# Patient Record
Sex: Female | Born: 1990 | Race: Black or African American | Hispanic: No | Marital: Single | Smoking: Never smoker
Health system: Southern US, Community
[De-identification: ages and names within clinical notes are randomized; demographics above are authoritative.]

## PROBLEM LIST (undated history)

## (undated) ENCOUNTER — Inpatient Hospital Stay (HOSPITAL_COMMUNITY): Payer: Self-pay

## (undated) ENCOUNTER — Ambulatory Visit (HOSPITAL_COMMUNITY): Admission: EM | Payer: Medicaid Other | Source: Home / Self Care

## (undated) DIAGNOSIS — IMO0002 Reserved for concepts with insufficient information to code with codable children: Secondary | ICD-10-CM

## (undated) DIAGNOSIS — Z6791 Unspecified blood type, Rh negative: Secondary | ICD-10-CM

## (undated) DIAGNOSIS — Q690 Accessory finger(s): Secondary | ICD-10-CM

## (undated) DIAGNOSIS — Z91010 Allergy to peanuts: Secondary | ICD-10-CM

## (undated) DIAGNOSIS — A749 Chlamydial infection, unspecified: Secondary | ICD-10-CM

## (undated) DIAGNOSIS — O26899 Other specified pregnancy related conditions, unspecified trimester: Secondary | ICD-10-CM

## (undated) HISTORY — PX: ROOT CANAL: SHX2363

## (undated) HISTORY — PX: WISDOM TOOTH EXTRACTION: SHX21

---
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
## 2008-03-29 DIAGNOSIS — R87619 Unspecified abnormal cytological findings in specimens from cervix uteri: Secondary | ICD-10-CM

## 2008-03-29 DIAGNOSIS — IMO0002 Reserved for concepts with insufficient information to code with codable children: Secondary | ICD-10-CM

## 2008-03-29 HISTORY — DX: Reserved for concepts with insufficient information to code with codable children: IMO0002

## 2008-03-29 HISTORY — DX: Unspecified abnormal cytological findings in specimens from cervix uteri: R87.619

## 2009-02-20 ENCOUNTER — Emergency Department (HOSPITAL_COMMUNITY): Admission: EM | Admit: 2009-02-20 | Discharge: 2009-02-20 | Payer: Self-pay | Admitting: Emergency Medicine

## 2009-03-24 ENCOUNTER — Encounter: Admission: RE | Admit: 2009-03-24 | Discharge: 2009-03-24 | Payer: Self-pay | Admitting: Internal Medicine

## 2010-07-01 LAB — URINALYSIS, ROUTINE W REFLEX MICROSCOPIC
Bilirubin Urine: NEGATIVE
Glucose, UA: NEGATIVE mg/dL
Ketones, ur: NEGATIVE mg/dL
Nitrite: NEGATIVE
Protein, ur: NEGATIVE mg/dL
Specific Gravity, Urine: 1.03 (ref 1.005–1.030)
Urobilinogen, UA: 0.2 mg/dL (ref 0.0–1.0)
pH: 5.5 (ref 5.0–8.0)

## 2010-07-01 LAB — URINE MICROSCOPIC-ADD ON

## 2010-07-01 LAB — POCT PREGNANCY, URINE: Preg Test, Ur: NEGATIVE

## 2010-07-01 LAB — WET PREP, GENITAL: Trich, Wet Prep: NONE SEEN

## 2010-12-22 LAB — POCT PREGNANCY, URINE
Operator id: 151391
Preg Test, Ur: NEGATIVE

## 2010-12-24 LAB — POCT PREGNANCY, URINE
Operator id: 208841
Preg Test, Ur: NEGATIVE

## 2010-12-24 LAB — POCT URINALYSIS DIP (DEVICE)
Glucose, UA: NEGATIVE
Nitrite: POSITIVE — AB
Operator id: 208841
Protein, ur: >=300 — AB
Urobilinogen, UA: 0.2

## 2010-12-24 LAB — GC/CHLAMYDIA PROBE AMP, GENITAL
Chlamydia, DNA Probe: NEGATIVE
GC Probe Amp, Genital: NEGATIVE

## 2010-12-24 LAB — WET PREP, GENITAL

## 2011-01-27 IMAGING — US US PELVIS COMPLETE
1 series · 14 of 25 positions shown · non-contrast
Comparison: None

CLINICAL DATA: Pelvic pain, low back pain

TRANSABDOMINAL AND TRANSVAGINAL ULTRASOUND OF PELVIS
TECHNIQUE: Both transabdominal and transvaginal ultrasound
examinations of the pelvis were performed including evaluation of
the uterus, ovaries, adnexal regions, and pelvic cul-de-sac.

[Series 1: us pelvis complete · 0.30mm/px · 14 of 31 slices shown]
[im 1/31]
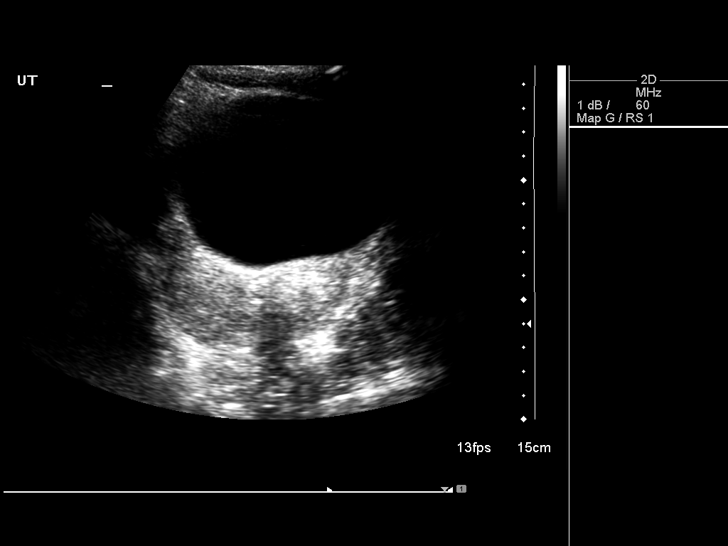
[im 3/31]
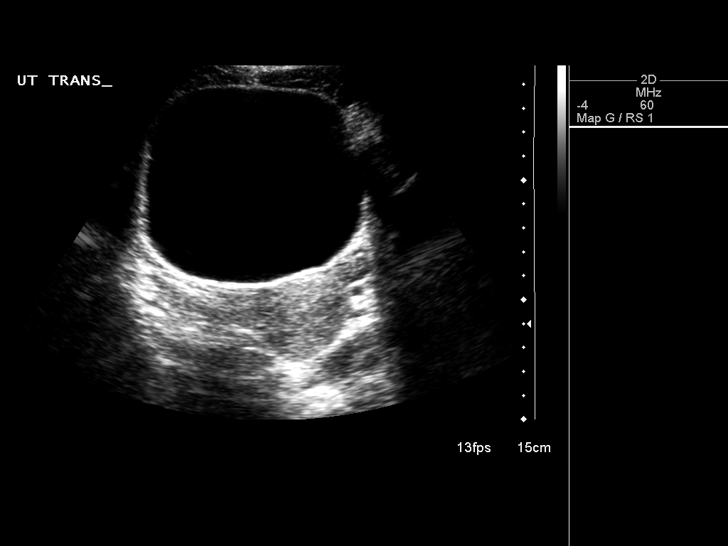
[im 6/31]
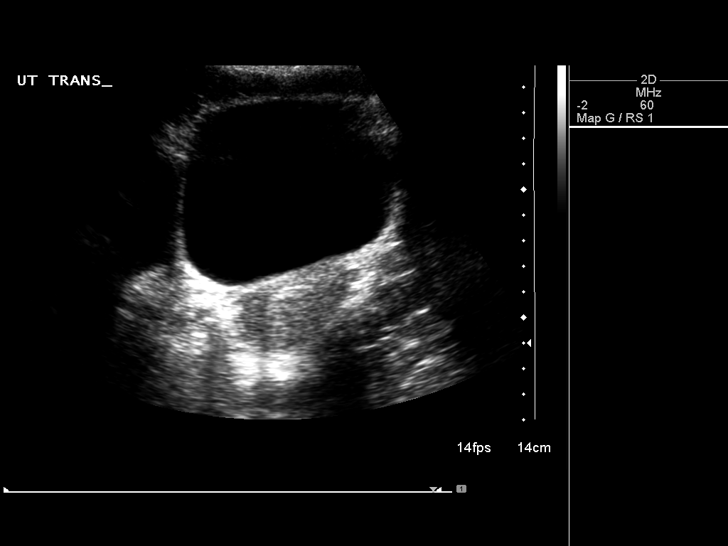
[im 8/31]
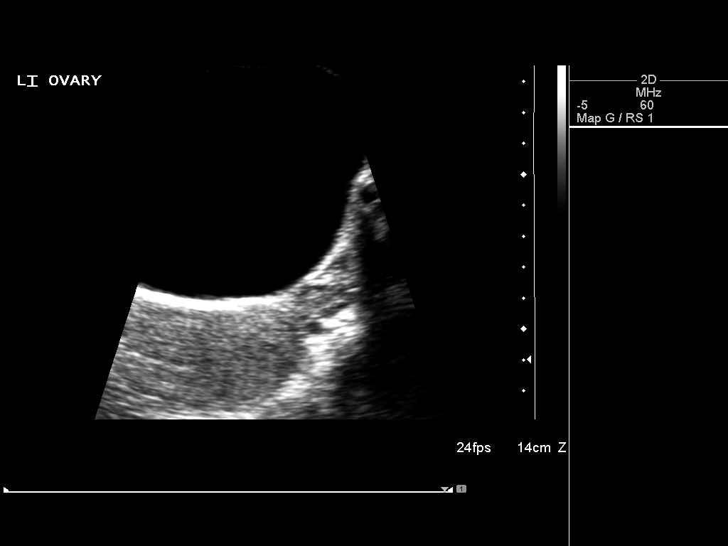
[im 11/31]
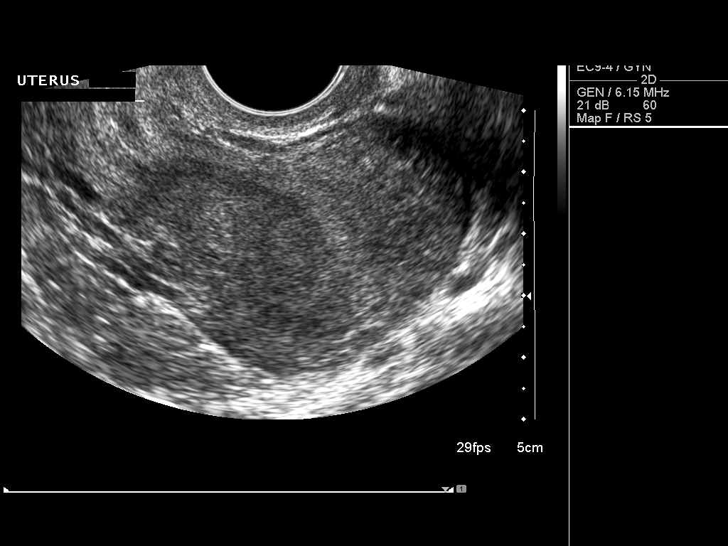
[im 12/31]
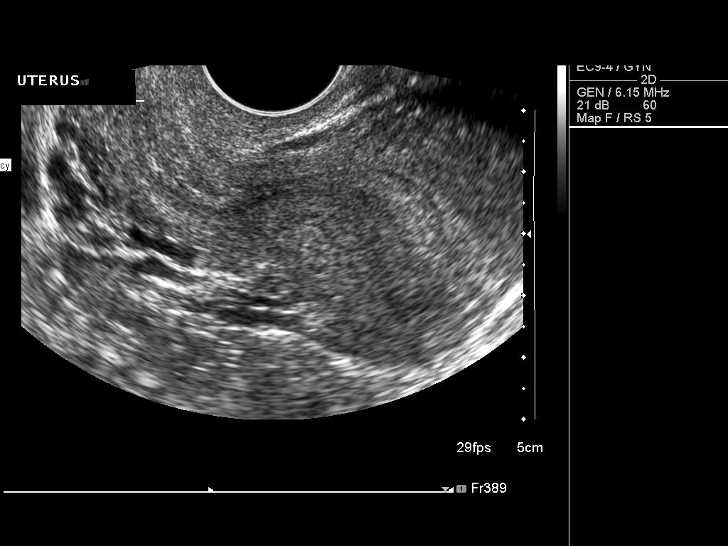
[im 14/31]
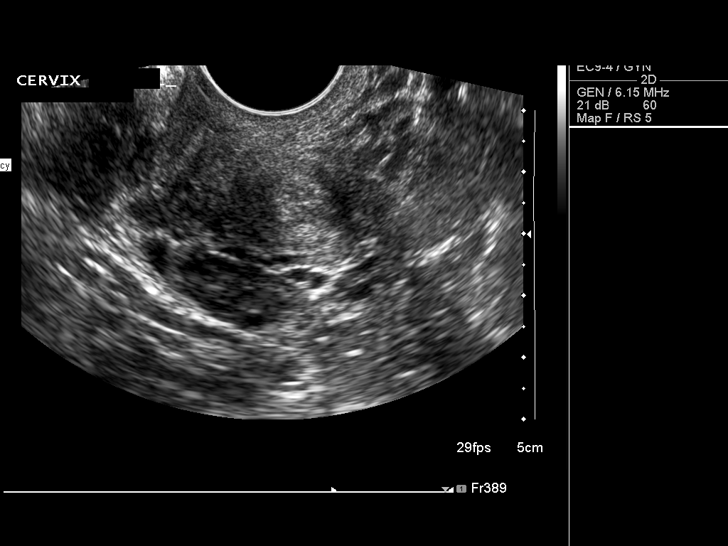
[im 17/31]
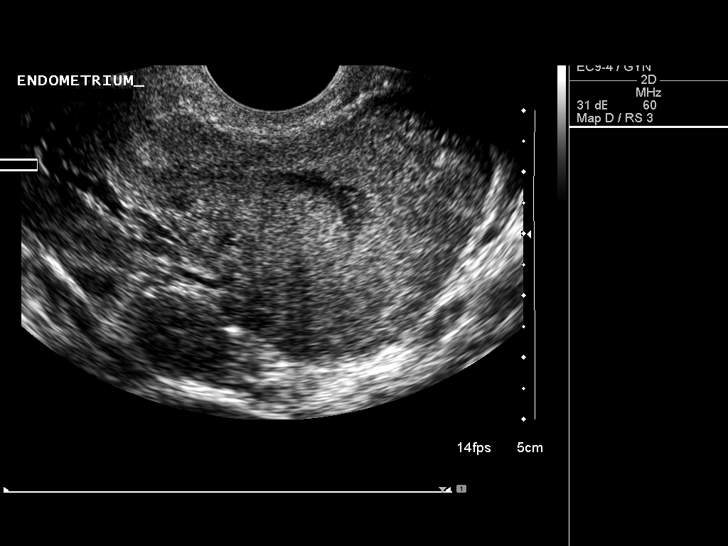
[im 19/31]
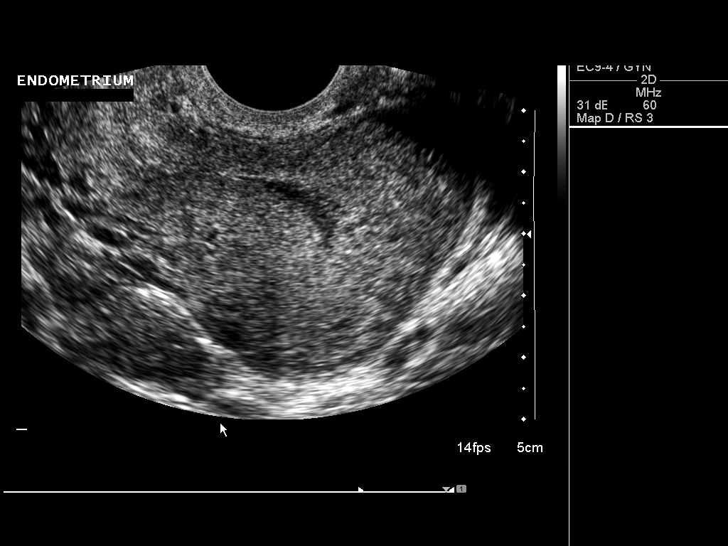
[im 21/31]
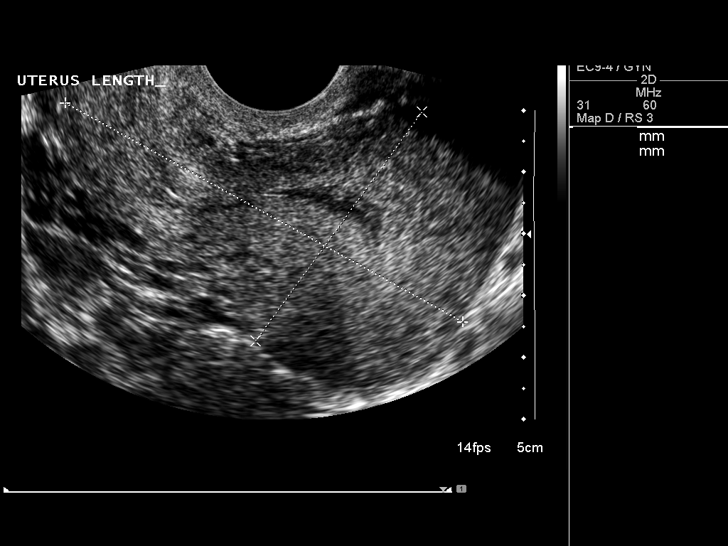
[im 23/31]
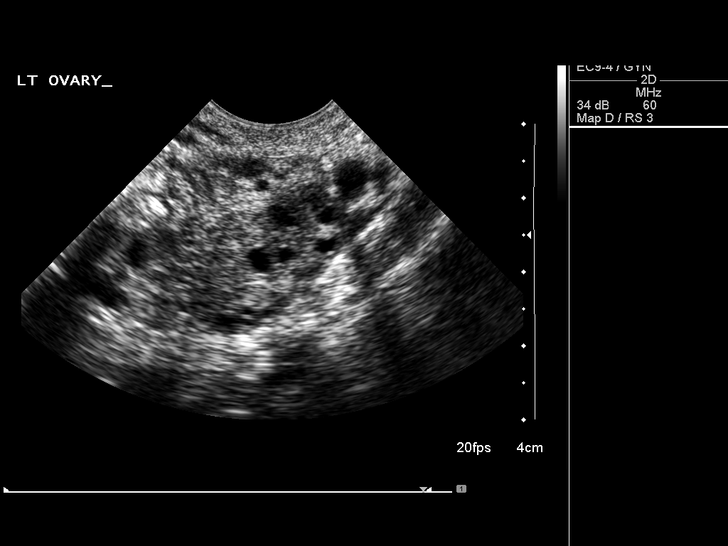
[im 26/31]
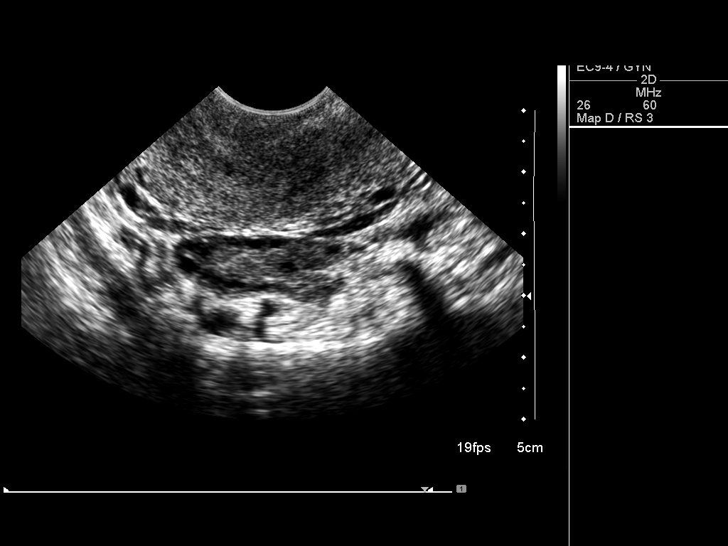
[im 28/31]
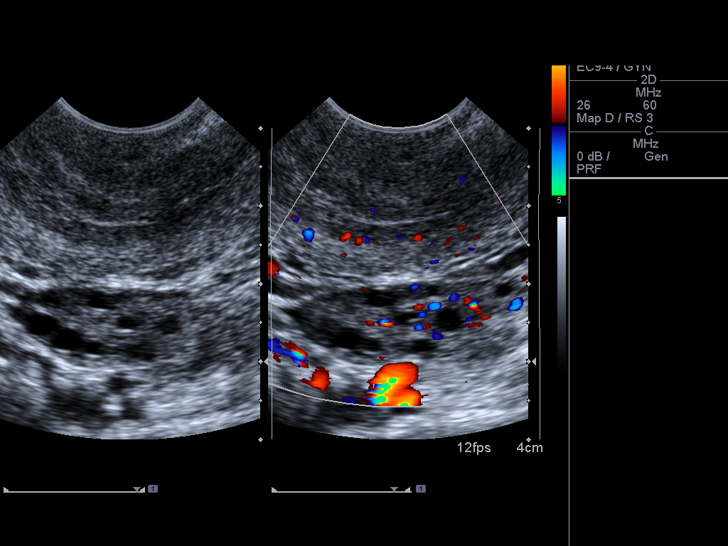
[im 31/31]
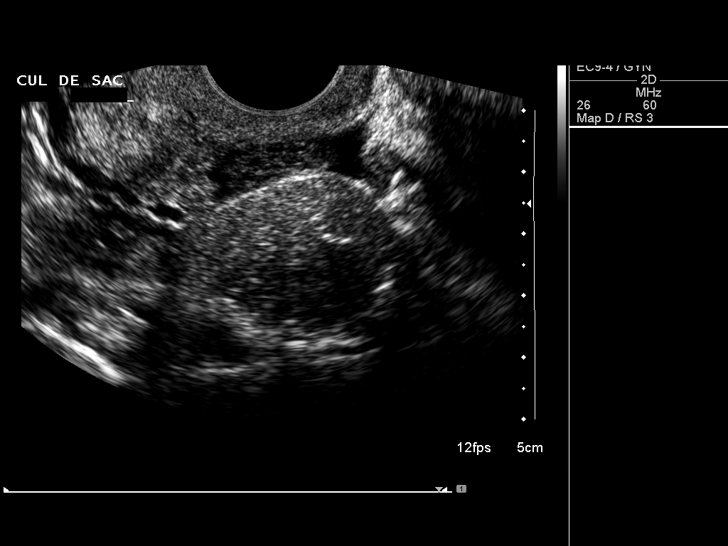

[14 of 25 positions shown; findings below may reference images not displayed]

FINDINGS: Uterus: The uterus is retroverted measuring 7.3 cm sagittally, with
a depth of 4.6 cm and width of 4.7 cm.

Endometrium:The endometrium measures 6.6 mm in thickness with some
fluid in the endometrial cavity.

Right Ovary :The right ovary is normal in size measuring 2.7 x
x 1.0 cm.

Left Ovary :The left ovary is normal measuring 2.5 x 1.3 x 1.3 cm.

Other Findings:  A small amount of fluid is noted in the cul-de-
sac.
IMPRESSION: 1.  The uterus is retroverted.  A small amount of fluid is noted in
the endometrial cavity.
 2.  The ovaries appear normal.
 3.  Small amount of fluid in cul-de-sac.

## 2012-03-29 NOTE — L&D Delivery Note (Signed)
Delivery Note At 3:48 AM a viable female, "Linda Rivas", was delivered via Vaginal, Spontaneous Delivery (Presentation: Middle Occiput Anterior).  APGAR: 8, 9; weight .   Placenta status: Intact, Spontaneous.  Cord: 3 vessels with the following complications: None.  Cord pH: NA Cord around body noted.  Patient actively pushed twice for delivery--vtx was at +3 prior to initiating pushing. 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  Anesthesia: Epidural  Episiotomy: None Lacerations: None Suture Repair: None Est. Blood Loss (mL): 200 cc  Mom to postpartum.  Baby to skin to skin.. Peds to f/u on management of vestigial extra digits. Placenta to pathology due to IUGR.   [name] 12/06/2012, 4:09 AM

## 2012-05-29 ENCOUNTER — Inpatient Hospital Stay (HOSPITAL_COMMUNITY)
Admission: AD | Admit: 2012-05-29 | Discharge: 2012-05-29 | Disposition: A | Discharge status: Home / Self Care | Payer: Medicaid Other | Source: Ambulatory Visit | Attending: Family Medicine | Admitting: Family Medicine

## 2012-05-29 ENCOUNTER — Encounter (HOSPITAL_COMMUNITY): Payer: Self-pay

## 2012-05-29 DIAGNOSIS — B3731 Acute candidiasis of vulva and vagina: Secondary | ICD-10-CM

## 2012-05-29 DIAGNOSIS — O2390 Unspecified genitourinary tract infection in pregnancy, unspecified trimester: Secondary | ICD-10-CM | POA: Insufficient documentation

## 2012-05-29 DIAGNOSIS — L259 Unspecified contact dermatitis, unspecified cause: Secondary | ICD-10-CM | POA: Insufficient documentation

## 2012-05-29 DIAGNOSIS — L309 Dermatitis, unspecified: Secondary | ICD-10-CM

## 2012-05-29 DIAGNOSIS — B373 Candidiasis of vulva and vagina: Secondary | ICD-10-CM

## 2012-05-29 DIAGNOSIS — N949 Unspecified condition associated with female genital organs and menstrual cycle: Secondary | ICD-10-CM | POA: Insufficient documentation

## 2012-05-29 LAB — URINALYSIS, ROUTINE W REFLEX MICROSCOPIC
Bilirubin Urine: NEGATIVE
Hgb urine dipstick: NEGATIVE
Specific Gravity, Urine: 1.025 (ref 1.005–1.030)
Urobilinogen, UA: 0.2 mg/dL (ref 0.0–1.0)
pH: 6 (ref 5.0–8.0)

## 2012-05-29 LAB — WET PREP, GENITAL
Clue Cells Wet Prep HPF POC: NONE SEEN
Trich, Wet Prep: NONE SEEN

## 2012-05-29 LAB — POCT PREGNANCY, URINE: Preg Test, Ur: POSITIVE — AB

## 2012-05-29 MED ORDER — FLUCONAZOLE 150 MG PO TABS: 150.0000 mg | ORAL_TABLET | Freq: Once | ORAL | Status: DC

## 2012-05-29 NOTE — MAU Provider Note (Signed)
History     CSN: [id number]  Arrival date & time 05/29/12  1150   None     Chief Complaint  Patient presents with  . Vaginal Pain    HPI MAYLI COVINGTON is a 22 y.o. @ [redacted]w[redacted]d gestation  who presents to the MAU with vaginal irritation. The symptoms started 2 weeks ago. Associated symptoms include thick white cheesy discharge and itching. She used OTC yeast cream on the outside and symptoms got better for a few days but then came back. The history was provided by the patient.  Past Medical History  Diagnosis Date  . Medical history non-contributory     No past surgical history on file.  No family history on file.  History  Substance Use Topics  . Smoking status: Not on file  . Smokeless tobacco: Not on file  . Alcohol Use: Not on file    OB History   Grav Para Term Preterm Abortions TAB SAB Ect Mult Living   1               Review of Systems  Constitutional: Negative for fever, chills, diaphoresis and fatigue.  HENT: Negative for ear pain, congestion, sore throat, facial swelling, neck pain, neck stiffness, dental problem and sinus pressure.   Eyes: Negative for photophobia, pain and discharge.  Respiratory: Negative for cough, chest tightness and wheezing.   Gastrointestinal: Negative for nausea, vomiting, abdominal pain, diarrhea, constipation and abdominal distention.  Genitourinary: Positive for frequency. Negative for dysuria, flank pain and difficulty urinating.       Vaginal discharge  Musculoskeletal: Negative for myalgias, back pain and gait problem.  Skin: Positive for rash. Negative for color change and wound.  Neurological: Negative for dizziness, speech difficulty, weakness, light-headedness, numbness and headaches.  Psychiatric/Behavioral: Negative for confusion and agitation. The patient is not nervous/anxious.     Allergies  Review of patient's allergies indicates not on file.  Home Medications  No current outpatient prescriptions on file.  BP  119/74  Pulse 109  Temp(Src) 98.2 F (36.8 C) (Oral)  Resp 16  Ht 5\' 1"  (1.549 m)  Wt 100 lb 6.4 oz (45.541 kg)  BMI 18.98 kg/m2  LMP 03/08/2012  Physical Exam  Nursing note and vitals reviewed. Constitutional: She is oriented to person, place, and time. She appears well-developed and well-nourished.  HENT:  Head: Normocephalic and atraumatic.  Eyes: EOM are normal. Pupils are equal, round, and reactive to light.  Neck: Neck supple.  Cardiovascular: Normal rate and regular rhythm.   Pulmonary/Chest: Effort normal.  Abdominal: Soft. There is no tenderness.  Positive FHT's.  Genitourinary:  External genitalia without lesions. Thick white discharge vaginal vault. Cervix long, closed, no CMT, no adnexal tenderness. Uterus consistent with dates.  Musculoskeletal: Normal range of motion. She exhibits no edema.  Neurological: She is alert and oriented to person, place, and time. No cranial nerve deficit.  Skin: Skin is warm and dry. Rash noted. No erythema.  eczema  Psychiatric: She has a normal mood and affect. Her behavior is normal. Judgment and thought content normal.   Procedures   Assessment: 22 y.o. female @ [redacted]w[redacted]d gestation with monilia vaginosis   Eczema  Plan:  Diflucan 150 mg PO now   Discussed moisturizing cream for eczema   Follow up for prenatal care.  Discussed with the patient and all questioned fully answered. She will return if any problems arise.    Medication List    ASK your doctor about these medications  folic acid 400 MCG tablet  Commonly known as:  FOLVITE  Take 800 mcg by mouth daily.

## 2012-05-29 NOTE — MAU Note (Signed)
Vaginal irritation, no change in vaginal discharge, headaches over the weekend, none today.

## 2012-05-30 LAB — GC/CHLAMYDIA PROBE AMP
CT Probe RNA: NEGATIVE
GC Probe RNA: NEGATIVE

## 2012-05-31 ENCOUNTER — Other Ambulatory Visit: Payer: Self-pay | Admitting: Medical

## 2012-05-31 DIAGNOSIS — B373 Candidiasis of vulva and vagina: Secondary | ICD-10-CM

## 2012-05-31 MED ORDER — FLUCONAZOLE 150 MG PO TABS: 150.0000 mg | ORAL_TABLET | Freq: Once | ORAL | Status: DC

## 2012-05-31 NOTE — Progress Notes (Signed)
Linda Rivas is a 22 y.o. G1P0 at [redacted]w[redacted]d who called MAU today because her Rx for Diflucan was not sent to her pharmacy. She saw Kerrie Buffalo, NP on 05/29/12. The note indicates that the patient should have been treated here in MAU, but the patient states that she was not given any medications while here that day. I have sent Rx for Diflucan to the pharmacy that she requested today, [facility] on [address] ([place]).

## 2012-05-31 NOTE — Progress Notes (Signed)
Phone call from patient:  Linda Rivas calls today stating that she was seen in MAU on Tuesday 05/29/12 and was prescribed Diflucan.  She reports that the Diflucan Rx has never appeared at the [facility] on [address] [facility].  She is requesting someone resend that prescription to [facility] on [address] today.  Ms. Ress can be reached at [phone]

## 2012-06-01 NOTE — MAU Provider Note (Signed)
Chart reviewed and agree with management and plan.  

## 2012-06-19 ENCOUNTER — Other Ambulatory Visit: Payer: Self-pay | Admitting: Obstetrics and Gynecology

## 2012-06-20 LAB — AFP, QUAD SCREEN
HCG, Total: 66469 m[IU]/mL
Interpretation-AFP: NEGATIVE
MoM for AFP: 2.07
MoM for hCG: 1.22
Open Spina bifida: NEGATIVE
Osb Risk: 1:1280 {titer}
Tri 18 Scr Risk Est: NEGATIVE
Trisomy 18 (Edward) Syndrome Interp.: 1:212000 {titer}
uE3 Mom: 1.41
uE3 Value: 0.4 ng/mL

## 2012-06-20 LAB — PRENATAL PANEL VII
Hemoglobin: 11.3 g/dL — ABNORMAL LOW (ref 12.0–15.0)
Hepatitis B Surface Ag: NEGATIVE
Lymphocytes Relative: 25 % (ref 12–46)
Lymphs Abs: 1.9 10*3/uL (ref 0.7–4.0)
MCV: 87.2 fL (ref 78.0–100.0)
Monocytes Relative: 10 % (ref 3–12)
Neutrophils Relative %: 64 % (ref 43–77)
Platelets: 266 10*3/uL (ref 150–400)
RBC: 3.84 MIL/uL — ABNORMAL LOW (ref 3.87–5.11)
Rubella: 1.53 Index — ABNORMAL HIGH (ref <0.90)
WBC: 7.8 10*3/uL (ref 4.0–10.5)

## 2012-06-20 LAB — OB RESULTS CONSOLE GC/CHLAMYDIA: Gonorrhea: NEGATIVE

## 2012-06-20 LAB — GC/CHLAMYDIA PROBE AMP, URINE: Chlamydia, Swab/Urine, PCR: NEGATIVE

## 2012-06-21 LAB — HEMOGLOBINOPATHY EVALUATION
Hemoglobin Other: 0 %
Hgb S Quant: 0 %

## 2012-06-21 LAB — CULTURE, OB URINE: Organism ID, Bacteria: NO GROWTH

## 2012-09-07 ENCOUNTER — Inpatient Hospital Stay (HOSPITAL_COMMUNITY)
Admission: AD | Admit: 2012-09-07 | Discharge: 2012-09-07 | Disposition: A | Discharge status: Home / Self Care | Payer: Medicaid Other | Source: Ambulatory Visit | Attending: Obstetrics and Gynecology | Admitting: Obstetrics and Gynecology

## 2012-09-07 ENCOUNTER — Encounter (HOSPITAL_COMMUNITY): Payer: Self-pay | Admitting: *Deleted

## 2012-09-07 DIAGNOSIS — R05 Cough: Secondary | ICD-10-CM | POA: Insufficient documentation

## 2012-09-07 DIAGNOSIS — R079 Chest pain, unspecified: Secondary | ICD-10-CM | POA: Insufficient documentation

## 2012-09-07 DIAGNOSIS — O99891 Other specified diseases and conditions complicating pregnancy: Secondary | ICD-10-CM | POA: Insufficient documentation

## 2012-09-07 DIAGNOSIS — R0602 Shortness of breath: Secondary | ICD-10-CM | POA: Insufficient documentation

## 2012-09-07 DIAGNOSIS — R059 Cough, unspecified: Secondary | ICD-10-CM | POA: Insufficient documentation

## 2012-09-07 NOTE — MAU Note (Signed)
Having some problems with her breathing, esp when she lays down at times.  Brings on a cough, feels a little tight.  No hx of asthma.

## 2012-09-07 NOTE — MAU Provider Note (Signed)
  History     CSN: [id number]  Arrival date and time: 09/07/12 1754   None     Chief Complaint  Patient presents with  . Shortness of Breath   HPI Comments: Pt is a G1P0 at [redacted]w[redacted]d that arrives unannounced to MAU after calling office today for advice regarding cough and chest tightness, and was instructed to go to urgent care or primary care. Upon questioning pt states that she has a dry cough and chest tightness mostly when she's laying down or if she exerts herself. She denies any fever or chills, she denies feeling SOB currently. SHe denies any pain, no ctx, vb or LOF, reports +FM. She reports occ having allergies in the spring, no hx asthma.   Shortness of Breath Pertinent negatives include no abdominal pain, chest pain, fever, leg swelling, orthopnea, sore throat, sputum production, vomiting or wheezing.      Past Medical History  Diagnosis Date  . Medical history non-contributory     No past surgical history on file.  No family history on file.  History  Substance Use Topics  . Smoking status: Not on file  . Smokeless tobacco: Not on file  . Alcohol Use: Not on file    Allergies: No Known Allergies  Prescriptions prior to admission  Medication Sig Dispense Refill  . Prenatal Vit-Fe Fumarate-FA (PRENATAL MULTIVITAMIN) TABS Take 1 tablet by mouth daily at 12 noon.        Review of Systems  Constitutional: Negative for fever and malaise/fatigue.  HENT: Negative for congestion and sore throat.   Respiratory: Positive for cough and shortness of breath. Negative for sputum production and wheezing.        Non-productive   Cardiovascular: Negative for chest pain, palpitations, orthopnea and leg swelling.  Gastrointestinal: Negative for heartburn, nausea, vomiting and abdominal pain.   Physical Exam   Blood pressure 107/57, pulse 88, temperature 98.2 F (36.8 C), temperature source Oral, resp. rate 20, weight 109 lb (49.442 kg), last menstrual period 03/08/2012, SpO2  100.00%.  Physical Exam  Nursing note and vitals reviewed. Constitutional: She is oriented to person, place, and time. She appears well-developed and well-nourished.  HENT:  Head: Normocephalic.  Eyes: Pupils are equal, round, and reactive to light.  Neck: Normal range of motion.  Cardiovascular: Normal rate, regular rhythm and normal heart sounds.   Respiratory: Effort normal and breath sounds normal.  GI: Soft. Bowel sounds are normal.  Genitourinary: Vagina normal.  Musculoskeletal: Normal range of motion.  Neurological: She is alert and oriented to person, place, and time. She has normal reflexes.  Skin: Skin is warm and dry.  Psychiatric: She has a normal mood and affect. Her behavior is normal.   FHR 130 reactive for GA toco quiet   MAU Course  Procedures    Assessment and Plan  IUP at [redacted]w[redacted]d VSS NAD Appears to be physiologic sx's r/t pregnancy, rv'd comfort measures Refer to pulmonology if sx's persist  Dc home stable condition, routine f/u   Emerlyn Mehlhoff M 09/07/2012, 6:24 PM

## 2012-11-29 DIAGNOSIS — O365990 Maternal care for other known or suspected poor fetal growth, unspecified trimester, not applicable or unspecified: Secondary | ICD-10-CM

## 2012-11-29 HISTORY — DX: Maternal care for other known or suspected poor fetal growth, unspecified trimester, not applicable or unspecified: O36.5990

## 2012-11-30 ENCOUNTER — Telehealth (HOSPITAL_COMMUNITY): Payer: Self-pay | Admitting: *Deleted

## 2012-11-30 NOTE — Telephone Encounter (Signed)
Preadmission screen  

## 2012-12-05 ENCOUNTER — Inpatient Hospital Stay (HOSPITAL_COMMUNITY)
Admission: RE | Admit: 2012-12-05 | Discharge: 2012-12-08 | DRG: 775 | Disposition: A | Discharge status: Home / Self Care | Payer: Medicaid Other | Source: Ambulatory Visit | Attending: Obstetrics and Gynecology | Admitting: Obstetrics and Gynecology

## 2012-12-05 ENCOUNTER — Encounter (HOSPITAL_COMMUNITY): Payer: Self-pay

## 2012-12-05 DIAGNOSIS — O99892 Other specified diseases and conditions complicating childbirth: Secondary | ICD-10-CM | POA: Diagnosis present

## 2012-12-05 DIAGNOSIS — IMO0002 Reserved for concepts with insufficient information to code with codable children: Secondary | ICD-10-CM | POA: Diagnosis present

## 2012-12-05 DIAGNOSIS — Z6791 Unspecified blood type, Rh negative: Secondary | ICD-10-CM | POA: Insufficient documentation

## 2012-12-05 DIAGNOSIS — Q740 Other congenital malformations of upper limb(s), including shoulder girdle: Secondary | ICD-10-CM

## 2012-12-05 DIAGNOSIS — O360990 Maternal care for other rhesus isoimmunization, unspecified trimester, not applicable or unspecified: Secondary | ICD-10-CM | POA: Diagnosis present

## 2012-12-05 DIAGNOSIS — O358XX0 Maternal care for other (suspected) fetal abnormality and damage, not applicable or unspecified: Secondary | ICD-10-CM | POA: Diagnosis present

## 2012-12-05 DIAGNOSIS — O365990 Maternal care for other known or suspected poor fetal growth, unspecified trimester, not applicable or unspecified: Principal | ICD-10-CM | POA: Diagnosis present

## 2012-12-05 DIAGNOSIS — O26899 Other specified pregnancy related conditions, unspecified trimester: Secondary | ICD-10-CM | POA: Insufficient documentation

## 2012-12-05 HISTORY — DX: Unspecified blood type, rh negative: Z67.91

## 2012-12-05 HISTORY — DX: Chlamydial infection, unspecified: A74.9

## 2012-12-05 HISTORY — DX: Other specified pregnancy related conditions, unspecified trimester: O26.899

## 2012-12-05 HISTORY — DX: Accessory finger(s): Q69.0

## 2012-12-05 HISTORY — DX: Allergy to peanuts: Z91.010

## 2012-12-05 HISTORY — DX: Reserved for concepts with insufficient information to code with codable children: IMO0002

## 2012-12-05 LAB — CBC
MCV: 92.1 fL (ref 78.0–100.0)
Platelets: 166 10*3/uL (ref 150–400)
RBC: 4.17 MIL/uL (ref 3.87–5.11)
RDW: 12.5 % (ref 11.5–15.5)
WBC: 7.1 10*3/uL (ref 4.0–10.5)

## 2012-12-05 MED ORDER — LIDOCAINE HCL (PF) 1 % IJ SOLN
30.0000 mL | INTRAMUSCULAR | Status: DC | PRN
  Filled 2012-12-05 (×2): qty 30

## 2012-12-05 MED ORDER — LACTATED RINGERS IV SOLN
INTRAVENOUS | Status: DC
  Administered 2012-12-05 – 2012-12-06 (×3): via INTRAVENOUS

## 2012-12-05 MED ORDER — BUTORPHANOL TARTRATE 1 MG/ML IJ SOLN
1.0000 mg | INTRAMUSCULAR | Status: DC | PRN
  Administered 2012-12-06: 1 mg via INTRAVENOUS
  Filled 2012-12-05: qty 1

## 2012-12-05 MED ORDER — LACTATED RINGERS IV SOLN
500.0000 mL | INTRAVENOUS | Status: DC | PRN
  Administered 2012-12-06 (×3): 500 mL via INTRAVENOUS

## 2012-12-05 MED ORDER — OXYTOCIN BOLUS FROM INFUSION
500.0000 mL | INTRAVENOUS | Status: DC
  Administered 2012-12-06: 500 mL via INTRAVENOUS

## 2012-12-05 MED ORDER — ZOLPIDEM TARTRATE 5 MG PO TABS
5.0000 mg | ORAL_TABLET | Freq: Every evening | ORAL | Status: DC | PRN
  Administered 2012-12-05: 5 mg via ORAL
  Filled 2012-12-05: qty 1

## 2012-12-05 MED ORDER — IBUPROFEN 600 MG PO TABS
600.0000 mg | ORAL_TABLET | Freq: Four times a day (QID) | ORAL | Status: DC | PRN
  Filled 2012-12-05: qty 1

## 2012-12-05 MED ORDER — CITRIC ACID-SODIUM CITRATE 334-500 MG/5ML PO SOLN: 30.0000 mL | ORAL | Status: DC | PRN

## 2012-12-05 MED ORDER — ACETAMINOPHEN 325 MG PO TABS: 650.0000 mg | ORAL_TABLET | ORAL | Status: DC | PRN

## 2012-12-05 MED ORDER — OXYTOCIN 40 UNITS IN LACTATED RINGERS INFUSION - SIMPLE MED: 1.0000 m[IU]/min | INTRAVENOUS | Status: DC

## 2012-12-05 MED ORDER — OXYTOCIN 40 UNITS IN LACTATED RINGERS INFUSION - SIMPLE MED
1.0000 m[IU]/min | INTRAVENOUS | Status: DC
  Administered 2012-12-05: 1 m[IU]/min via INTRAVENOUS
  Filled 2012-12-05: qty 1000

## 2012-12-05 MED ORDER — FLEET ENEMA 7-19 GM/118ML RE ENEM: 1.0000 | ENEMA | RECTAL | Status: DC | PRN

## 2012-12-05 MED ORDER — ONDANSETRON HCL 4 MG/2ML IJ SOLN: 4.0000 mg | Freq: Four times a day (QID) | INTRAMUSCULAR | Status: DC | PRN

## 2012-12-05 MED ORDER — MISOPROSTOL 25 MCG QUARTER TABLET: 25.0000 ug | ORAL_TABLET | ORAL | Status: DC | PRN

## 2012-12-05 MED ORDER — TERBUTALINE SULFATE 1 MG/ML IJ SOLN: 0.2500 mg | Freq: Once | INTRAMUSCULAR | Status: AC | PRN

## 2012-12-05 MED ORDER — OXYCODONE-ACETAMINOPHEN 5-325 MG PO TABS: 1.0000 | ORAL_TABLET | ORAL | Status: DC | PRN

## 2012-12-05 MED ORDER — OXYTOCIN 40 UNITS IN LACTATED RINGERS INFUSION - SIMPLE MED: 62.5000 mL/h | INTRAVENOUS | Status: DC

## 2012-12-05 NOTE — H&P (Signed)
Linda Rivas is a 22 y.o. female, G1P0 at 73 6/7 weeks, presenting for induction due to IUGR.  Denies leaking or bleeding, reports +FM.  Reports contractions all day, with 4/10 intensity at present.  Patient Active Problem List   Diagnosis Date Noted  . IUGR (intrauterine growth restriction) 12/05/2012  . 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 Rh negative status during pregnancy     History of present pregnancy: Patient entered care at 16 2/7 weeks.   EDC of 12/13/12 was established by LMP and in agreement with [place] at 6 weeks.   Anatomy scan:  19 2/7 weeks, with normal findings and an posterior/fundal placenta.  Bilateral polydactly noted. Additional [place] evaluations:   35 2/7 weeks--due to S<D, with EFW 21%ile, 5+5, normal fluid. 38 1/7 weeks--f/u on growth, with EFW <3%ile, 5+1, normal fluid/BPP/dopplers Significant prenatal events:  SGA at 35 weeks, progressing to symmetric IUGR by 38 weeks.  Received Rhophylac at 29 weeks. Last evaluation:  11/29/12, with [place] EFW 5+1, <3%ile, normal fluid, BPP 8/8 and normal dopplers.  Cervix 1 cm, 50%, vtx, -2 at that visit.  History OB History   Grav Para Term Preterm Abortions TAB SAB Ect Mult Living   1              Past Medical History  Diagnosis Date  . Chlamydia   . Polydactyly of fingers   . Abnormal Pap smear 2010  . Peanut allergy   . Rh negative status during pregnancy     Received Rhophylac at 29 weeks.   Past Surgical History  Procedure Laterality Date  . Wisdom tooth extraction    . Root canal     Family History: Father HTN, mother alcoholic Social History:  reports that she has quit smoking. She has never used smokeless tobacco. She reports that she uses illicit drugs (Marijuana). She reports that she does not drink alcohol.  No illicit drug use during pregnancy.  FOB, , is involved and supportive.    Prenatal Transfer Tool  Maternal Diabetes: No Genetic Screening: Normal Quad  screen Maternal Ultrasounds/Referrals: Abnormal:  Findings:   IUGR, symmetrical--last EFW 5+1 on 12/01/12, <3%ile.  Fetus with bilateral polydactly--patient has hx of same. Fetal Ultrasounds or other Referrals:  None Maternal Substance Abuse:  No Significant Maternal Medications:  None Significant Maternal Lab Results:  Lab values include: Group B Strep negative, Rh negative Other Comments:  Induction for IUGR  ROS:  Contractions, +FM.  Dilation: 1 Effacement (%): 70;80 Exam by:: v. Randye Treichler, cnm Blood pressure 111/62, pulse 98, temperature 98.2 F (36.8 C), temperature source Oral, resp. rate 18, height 5\' 1"  (1.549 m), weight 114 lb (51.71 kg), last menstrual period 03/08/2012.  Exam Physical Exam  Chest clear Heart RRR without murmur Abd gravid, NT Pelvic--loose 1 cm, 75%, vtx, -1, cervix slightly posterior.  Pelvis feels adequate. Ext WNL  FHR Category 1 UCs q 4 min, irregular quality.  Prenatal labs: ABO, Rh: A/NEG/-- (03/24 1250)--Received Rhophylac at 29 weeks. Antibody: NEG (03/24 1250) Rubella: 1.53 (03/24 1250) RPR: NON REAC (03/24 1250)  HBsAg: NEGATIVE (03/24 1250)  HIV: NON REACTIVE (03/24 1250)  GBS: Negative (09/04 0000)  Hgb electrophoresis WNL: Glucola WNL Hgb 11.3 at NOB, 11.1 at 29 weeks Platelets 266 at NOB, 203 at 29 weeks Pap WNL 06/2012. Cultures negative at NOB and 8/0/14.  Assessment/Plan: IUP at 38 6/7 weeks IUGR--for induction ? Early labor GBS negative  Plan: Admitted to [facility] per  consult with Dr. Estanislado Pandy Routine [facility] orders Continuous EFM Will start low dose pitocin for cervical ripening during night, with advancement in am or if active labor ensues. Pain medication options reviewed. Reviewed indications and R/B of induction with patient and family, including possibility of need for serial induction, additional intervention, NRFHR, and need for C/S--patient and family seem to understand these risks and are in agreement with  proceeding with induction.  Nigel Bridgeman 12/05/2012, 8:52 PM

## 2012-12-06 ENCOUNTER — Encounter (HOSPITAL_COMMUNITY): Payer: Self-pay | Admitting: Anesthesiology

## 2012-12-06 ENCOUNTER — Inpatient Hospital Stay (HOSPITAL_COMMUNITY): Payer: Medicaid Other | Admitting: Anesthesiology

## 2012-12-06 ENCOUNTER — Encounter (HOSPITAL_COMMUNITY): Payer: Self-pay

## 2012-12-06 LAB — CBC
HCT: 34.7 % — ABNORMAL LOW (ref 36.0–46.0)
MCH: 31.9 pg (ref 26.0–34.0)
MCV: 93 fL (ref 78.0–100.0)
RDW: 12.5 % (ref 11.5–15.5)
WBC: 10.9 10*3/uL — ABNORMAL HIGH (ref 4.0–10.5)

## 2012-12-06 LAB — RPR: RPR Ser Ql: NONREACTIVE

## 2012-12-06 MED ORDER — LANOLIN HYDROUS EX OINT: TOPICAL_OINTMENT | CUTANEOUS | Status: DC | PRN

## 2012-12-06 MED ORDER — PRENATAL MULTIVITAMIN CH: 1.0000 | ORAL_TABLET | Freq: Every day | ORAL | Status: DC

## 2012-12-06 MED ORDER — RHO D IMMUNE GLOBULIN 1500 UNIT/2ML IJ SOLN
300.0000 ug | Freq: Once | INTRAMUSCULAR | Status: AC
  Administered 2012-12-06: 300 ug via INTRAMUSCULAR
  Filled 2012-12-06: qty 2

## 2012-12-06 MED ORDER — OXYCODONE-ACETAMINOPHEN 5-325 MG PO TABS
1.0000 | ORAL_TABLET | ORAL | Status: DC | PRN
  Filled 2012-12-06: qty 1

## 2012-12-06 MED ORDER — DIBUCAINE 1 % RE OINT: 1.0000 "application " | TOPICAL_OINTMENT | RECTAL | Status: DC | PRN

## 2012-12-06 MED ORDER — WITCH HAZEL-GLYCERIN EX PADS: 1.0000 "application " | MEDICATED_PAD | CUTANEOUS | Status: DC | PRN

## 2012-12-06 MED ORDER — ONDANSETRON HCL 4 MG PO TABS: 4.0000 mg | ORAL_TABLET | ORAL | Status: DC | PRN

## 2012-12-06 MED ORDER — SENNOSIDES-DOCUSATE SODIUM 8.6-50 MG PO TABS
2.0000 | ORAL_TABLET | ORAL | Status: DC
  Administered 2012-12-07: 2 via ORAL

## 2012-12-06 MED ORDER — BENZOCAINE-MENTHOL 20-0.5 % EX AERO: 1.0000 "application " | INHALATION_SPRAY | CUTANEOUS | Status: DC | PRN

## 2012-12-06 MED ORDER — TETANUS-DIPHTH-ACELL PERTUSSIS 5-2.5-18.5 LF-MCG/0.5 IM SUSP: 0.5000 mL | Freq: Once | INTRAMUSCULAR | Status: DC

## 2012-12-06 MED ORDER — PHENYLEPHRINE 40 MCG/ML (10ML) SYRINGE FOR IV PUSH (FOR BLOOD PRESSURE SUPPORT)
80.0000 ug | PREFILLED_SYRINGE | INTRAVENOUS | Status: DC | PRN
  Filled 2012-12-06: qty 5
  Filled 2012-12-06: qty 2

## 2012-12-06 MED ORDER — FENTANYL 2.5 MCG/ML BUPIVACAINE 1/10 % EPIDURAL INFUSION (WH - ANES)
14.0000 mL/h | INTRAMUSCULAR | Status: DC | PRN
  Filled 2012-12-06: qty 125

## 2012-12-06 MED ORDER — DIPHENHYDRAMINE HCL 25 MG PO CAPS: 25.0000 mg | ORAL_CAPSULE | Freq: Four times a day (QID) | ORAL | Status: DC | PRN

## 2012-12-06 MED ORDER — FENTANYL 2.5 MCG/ML BUPIVACAINE 1/10 % EPIDURAL INFUSION (WH - ANES)
INTRAMUSCULAR | Status: DC | PRN
  Administered 2012-12-06: 14 mL/h via EPIDURAL

## 2012-12-06 MED ORDER — IBUPROFEN 600 MG PO TABS: 600.0000 mg | ORAL_TABLET | Freq: Four times a day (QID) | ORAL | Status: DC

## 2012-12-06 MED ORDER — PHENYLEPHRINE 40 MCG/ML (10ML) SYRINGE FOR IV PUSH (FOR BLOOD PRESSURE SUPPORT)
80.0000 ug | PREFILLED_SYRINGE | INTRAVENOUS | Status: DC | PRN
  Filled 2012-12-06: qty 2

## 2012-12-06 MED ORDER — ONDANSETRON HCL 4 MG/2ML IJ SOLN: 4.0000 mg | INTRAMUSCULAR | Status: DC | PRN

## 2012-12-06 MED ORDER — OXYCODONE-ACETAMINOPHEN 5-325 MG PO TABS: 1.0000 | ORAL_TABLET | ORAL | Status: DC | PRN

## 2012-12-06 MED ORDER — ZOLPIDEM TARTRATE 5 MG PO TABS: 5.0000 mg | ORAL_TABLET | Freq: Every evening | ORAL | Status: DC | PRN

## 2012-12-06 MED ORDER — SIMETHICONE 80 MG PO CHEW: 80.0000 mg | CHEWABLE_TABLET | ORAL | Status: DC | PRN

## 2012-12-06 MED ORDER — SENNOSIDES-DOCUSATE SODIUM 8.6-50 MG PO TABS: 2.0000 | ORAL_TABLET | ORAL | Status: DC

## 2012-12-06 MED ORDER — EPHEDRINE 5 MG/ML INJ
10.0000 mg | INTRAVENOUS | Status: DC | PRN
  Filled 2012-12-06: qty 4
  Filled 2012-12-06: qty 2

## 2012-12-06 MED ORDER — DIPHENHYDRAMINE HCL 50 MG/ML IJ SOLN: 12.5000 mg | INTRAMUSCULAR | Status: DC | PRN

## 2012-12-06 MED ORDER — FENTANYL 2.5 MCG/ML BUPIVACAINE 1/10 % EPIDURAL INFUSION (WH - ANES): INTRAMUSCULAR | Status: DC | PRN

## 2012-12-06 MED ORDER — LACTATED RINGERS IV SOLN
500.0000 mL | Freq: Once | INTRAVENOUS | Status: AC
  Administered 2012-12-06: 500 mL via INTRAVENOUS

## 2012-12-06 MED ORDER — IBUPROFEN 600 MG PO TABS
600.0000 mg | ORAL_TABLET | Freq: Four times a day (QID) | ORAL | Status: DC
  Administered 2012-12-06: 600 mg via ORAL
  Filled 2012-12-06 (×6): qty 1

## 2012-12-06 MED ORDER — EPHEDRINE 5 MG/ML INJ
10.0000 mg | INTRAVENOUS | Status: DC | PRN
  Filled 2012-12-06: qty 2

## 2012-12-06 MED ORDER — LIDOCAINE HCL (PF) 1 % IJ SOLN
INTRAMUSCULAR | Status: DC | PRN
  Administered 2012-12-06: 9 mL
  Administered 2012-12-06: 6 mL

## 2012-12-06 NOTE — Anesthesia Preprocedure Evaluation (Signed)
Anesthesia Evaluation  Patient identified by MRN, date of birth, ID band Patient awake    Reviewed: Allergy & Precautions, H&P , Patient's Chart, lab work & pertinent test results  Airway Mallampati: I TM Distance: >3 FB Neck ROM: full    Dental no notable dental hx.    Pulmonary neg pulmonary ROS,    Pulmonary exam normal       Cardiovascular negative cardio ROS      Neuro/Psych negative neurological ROS  negative psych ROS   GI/Hepatic negative GI ROS, Neg liver ROS,   Endo/Other  negative endocrine ROS  Renal/GU negative Renal ROS  negative genitourinary   Musculoskeletal negative musculoskeletal ROS (+)   Abdominal Normal abdominal exam  (+)   Peds negative pediatric ROS (+)  Hematology negative hematology ROS (+)   Anesthesia Other Findings   Reproductive/Obstetrics (+) Pregnancy                           Anesthesia Physical Anesthesia Plan  ASA: II  Anesthesia Plan: Epidural   Post-op Pain Management:    Induction:   Airway Management Planned:   Additional Equipment:   Intra-op Plan:   Post-operative Plan:   Informed Consent: I have reviewed the patients History and Physical, chart, labs and discussed the procedure including the risks, benefits and alternatives for the proposed anesthesia with the patient or authorized representative who has indicated his/her understanding and acceptance.     Plan Discussed with:   Anesthesia Plan Comments:         Anesthesia Quick Evaluation

## 2012-12-06 NOTE — Anesthesia Procedure Notes (Signed)
Epidural Patient location during procedure: OB Start time: 12/06/2012 1:16 AM End time: 12/06/2012 1:20 AM  Staffing Anesthesiologist: Sandrea Hughs Performed by: anesthesiologist   Preanesthetic Checklist Completed: patient identified, surgical consent, pre-op evaluation, timeout performed, IV checked, risks and benefits discussed and monitors and equipment checked  Epidural Patient position: sitting Prep: site prepped and draped and DuraPrep Patient monitoring: continuous pulse ox and blood pressure Approach: midline Injection technique: LOR air  Needle:  Needle type: Tuohy  Needle gauge: 17 G Needle length: 9 cm and 9 Needle insertion depth: 4 cm Catheter type: closed end flexible Catheter size: 19 Gauge Catheter at skin depth: 9 cm Test dose: negative and Other  Assessment Sensory level: T9 Events: blood not aspirated, injection not painful, no injection resistance, negative IV test and no paresthesia  Additional Notes Reason for block:procedure for pain

## 2012-12-06 NOTE — Progress Notes (Signed)
Patient c/o pain in lower abdomen.  Appears groggy and eyes are closed while we are talking.  Encouraged her to ambulate to the bathroom to empty bladder often.

## 2012-12-06 NOTE — Progress Notes (Signed)
UR chart review completed.  

## 2012-12-06 NOTE — Progress Notes (Signed)
  Subjective: Requesting pain medication.  Partner at bedside, supportive.  Patient breathing through contractions.   Objective: BP 112/74  Pulse 89  Temp(Src) 97.8 F (36.6 C) (Oral)  Resp 18  Ht 5\' 1"  (1.549 m)  Wt 114 lb (51.71 kg)  BMI 21.55 kg/m2  LMP 03/08/2012      FHT:  Category 1--negative CST UC:   regular, every 2-3 minutes SVE:   Dilation: 2 Effacement (%): 90 Station: -1 Exam by:: v. Eligha Kmetz, cnm Pitocin on 4 mu/min  Assessment / Plan: Induction of labor--on low-dose pitocin IUGR Will give Stadol and continue to observe  Gentri Guardado 12/06/2012, 12:11 AM

## 2012-12-06 NOTE — Anesthesia Postprocedure Evaluation (Signed)
Anesthesia Post Note  Patient: Linda Rivas  Procedure(s) Performed: * No procedures listed *  Anesthesia type: Epidural  Patient location: Mother/Baby  Post pain: Pain level controlled  Post assessment: Post-op Vital signs reviewed  Last Vitals: BP 112/74  Pulse 81  Temp(Src) 36.9 C (Oral)  Resp 18  Ht 5\' 1"  (1.549 m)  Wt 114 lb (51.71 kg)  BMI 21.55 kg/m2  SpO2 100%  LMP 03/08/2012  Post vital signs: Reviewed  Level of consciousness: awake  Complications: No apparent anesthesia complications

## 2012-12-06 NOTE — Progress Notes (Signed)
  Subjective: Epidural just placed--getting comfortable.  Objective: BP 100/66  Pulse 85  Temp(Src) 97.8 F (36.6 C) (Oral)  Resp 18  Ht 5\' 1"  (1.549 m)  Wt 114 lb (51.71 kg)  BMI 21.55 kg/m2  SpO2 100%  LMP 03/08/2012      FHT:  Category 2--series of late decels with patient on back, resolved with position change and VE, + response to scalp stim. UC:   regular, every 2 minutes SVE:   Dilation: 7 Effacement (%): 90 Station: 0 Exam by:: v. Azaiah Mello, cnm BBOW--AROM, clear fluid, small amount bloody show.  Assessment / Plan: Induction for IUGR--progressive labor Will CTO FHR status closely. Decrease pitocin prn.  Keiera Strathman 12/06/2012, 1:49 AM

## 2012-12-06 NOTE — Progress Notes (Signed)
Patient c/o 10 out of 10 pain.  Ambulated to bathroom.  Voided and stated a pain level of 3 out of 10 afterwards.  Refused pain medicine at this time.  Stated she would like to eat prior to taking medication and will call me when she is ready.

## 2012-12-06 NOTE — Progress Notes (Signed)
  Subjective: Still very sleepy s/p Stadol at 1:10am--unaware of any pressure.  Objective: BP 105/53  Pulse 81  Temp(Src) 97.6 F (36.4 C) (Oral)  Resp 18  Ht 5\' 1"  (1.549 m)  Wt 114 lb (51.71 kg)  BMI 21.55 kg/m2  SpO2 100%  LMP 03/08/2012   Total I/O In: -  Out: 150 [Urine:150]  FHT:  Category 2--baseline 120s with decreased variability since Stadol. UC:   regular, every 3-5 minutes SVE:   Dilation: 10 Effacement (%): 100 Station: +2 Exam by:: v. Nazaiah Navarrete, cnm Vtx visible at introitus without contraction Pitocin on 2 mu/min.  Assessment / Plan: 2nd stage labor Will initiate active pushing with urge or prn based on FHR tracing.  Nigel Bridgeman 12/06/2012, 3:32 AM

## 2012-12-06 NOTE — Progress Notes (Signed)
Patient states her pain is "so much better, I'm not in any pain now".  Refused to take scheduled Motrin at this time. No complaints.

## 2012-12-07 LAB — RH IG WORKUP (INCLUDES ABO/RH)
Antibody Screen: POSITIVE
Fetal Screen: NEGATIVE

## 2012-12-07 NOTE — Progress Notes (Signed)
Post Partum Day 1  Subjective: no complaints, up ad lib, voiding and tolerating PO  Objective: Blood pressure 99/66, pulse 66, temperature 97.8 F (36.6 C), temperature source Oral, resp. rate 18, height 5\' 1"  (1.549 m), weight 114 lb (51.71 kg), last menstrual period 03/08/2012, SpO2 100.00%, unknown if currently breastfeeding.  Physical Exam:  General: no distress Lochia: appropriate Uterine Fundus: firm Incision: NA DVT Evaluation: No evidence of DVT seen on physical exam.   Recent Labs  12/05/12 1950 12/06/12 0730  HGB 13.4 11.9*  HCT 38.4 34.7*    Assessment/Plan: Breastfeeding and Contraception Nexplanon   LOS: 2 days   Annise Boran V 12/07/2012, 10:35 AM

## 2012-12-07 NOTE — Clinical Social Work Maternal (Signed)
    Clinical Social Work Department PSYCHOSOCIAL ASSESSMENT - MATERNAL/CHILD 12/07/2012  Patient:  Linda Rivas, Linda Rivas  Account Number:  [id number]  Admit Date:  12/05/2012  Marjo Bicker Name:   Linda Rivas    Clinical Social Worker:  Nobie Putnam, LCSW   Date/Time:  12/07/2012 03:40 PM  Date Referred:  12/07/2012   Referral source  CN     Referred reason  Substance Abuse   Other referral source:    I:  FAMILY / HOME ENVIRONMENT Child's legal guardian:  PARENT  Guardian - Name Guardian - Age Guardian - Address  Linda Rivas 22 [address]; [place], [place] [address]  Elicia Lamp 21    Other household support members/support persons Name Relationship DOB  Rudean Curt    Other support:   Family    II  PSYCHOSOCIAL DATA Information Source:  Patient Interview  Event organiser Employment:   Financial resources:  Medicaid If Medicaid - County:  [place] Other  [facility]  Food Stamps   School / Grade:   Maternity Care Coordinator / Child Services Coordination / Early Interventions:   Franchot Erichsen  Cultural issues impacting care:    III  STRENGTHS Strengths  Adequate Resources  Home prepared for Child (including basic supplies)  Supportive family/friends   Strength comment:    IV  RISK FACTORS AND CURRENT PROBLEMS Current Problem:  YES   Risk Factor & Current Problem Patient Issue Family Issue Risk Factor / Current Problem Comment  Substance Abuse Y N Hx of MJ use    V  SOCIAL WORK ASSESSMENT CSW met with pt to assess history of MJ use.  Pt admits to smoking MJ "once every 2 Rivas, prior to pregnancy confirmation at 6 Rivas.  She denies regular use prior to +UPT, as she described herself as a "social smoker."  She denies other illegal substance use.  CSW explained hospital drug testing policy & pt verbalized understanding.  UDS & meconium results are pending.  Pt seemed concerned about the results because she continued to be around  MJ smoke during pregnancy.  Pt has all the necessary supplies for the infant & good family support.  FOB is involved, per pt. Pt appears to be bonding well & appropriate at this time. CSW will monitor drug screen results & make a referral if needed.      VI SOCIAL WORK PLAN Social Work Plan  No Further Intervention Required / No Barriers to Discharge   Type of pt/family education:   If child protective services report - county:   If child protective services report - date:   Information/referral to community resources comment:   Other social work plan:

## 2012-12-07 NOTE — Progress Notes (Signed)
Mom requested a double electric pump.  Set up and educated mother on use and frequency.

## 2012-12-08 MED ORDER — IBUPROFEN 600 MG PO TABS: 600.0000 mg | ORAL_TABLET | Freq: Four times a day (QID) | ORAL | Status: DC | PRN

## 2012-12-08 MED ORDER — OXYCODONE-ACETAMINOPHEN 5-325 MG PO TABS: 1.0000 | ORAL_TABLET | Freq: Four times a day (QID) | ORAL | Status: DC | PRN

## 2012-12-08 NOTE — Discharge Summary (Signed)
Obstetric Discharge Summary Reason for Admission: induction of labor secondary to IUGR Prenatal Procedures: ultrasound Intrapartum Procedures: spontaneous vaginal delivery Postpartum Procedures: none Complications-Operative and Postpartum: none Hemoglobin  Date Value Range Status  12/06/2012 11.9* 12.0 - 15.0 g/dL Final     HCT  Date Value Range Status  12/06/2012 34.7* 36.0 - 46.0 % Final   Pt denies any problems.  Nl bleeding.  BF'ing and plans implanon.  On exam pt appears to have an umbilical hernia.  Instructed on precautions.  Pt to notify us at [facility] visit so she can be referred to General Surgery for consultation.  Pt instructed to call with any issues.  Physical Exam:  General: alert and no distress Lochia: appropriate Uterine Fundus: firm Incision: n/a DVT Evaluation: No evidence of DVT seen on physical exam.  Discharge Diagnoses: Term Pregnancy-delivered  Discharge Information: Date: 12/08/2012 Activity: pelvic rest Diet: routine Medications: Ibuprofen and Percocet Condition: stable Instructions: refer to practice specific booklet Discharge to: home Follow-up Information   Follow up with [facility] In 6 weeks. (for post partum visit)    Specialty:  Obstetrics and Gynecology   Contact information:   [address] [place] [address] [phone]      Newborn Data: Live born female  Birth Weight: 5 lb 9.2 oz (2529 g) APGAR: 8, 9  Home with mother.  Allexa Acoff Y 12/08/2012, 10:01 AM

## 2013-04-27 ENCOUNTER — Emergency Department (HOSPITAL_COMMUNITY)
Admission: EM | Admit: 2013-04-27 | Discharge: 2013-04-27 | Disposition: A | Discharge status: Home / Self Care | Payer: Medicaid Other | Attending: Emergency Medicine | Admitting: Emergency Medicine

## 2013-04-27 ENCOUNTER — Encounter (HOSPITAL_COMMUNITY): Payer: Self-pay | Admitting: Emergency Medicine

## 2013-04-27 DIAGNOSIS — Z792 Long term (current) use of antibiotics: Secondary | ICD-10-CM | POA: Insufficient documentation

## 2013-04-27 DIAGNOSIS — Z79899 Other long term (current) drug therapy: Secondary | ICD-10-CM | POA: Insufficient documentation

## 2013-04-27 DIAGNOSIS — Z3202 Encounter for pregnancy test, result negative: Secondary | ICD-10-CM | POA: Insufficient documentation

## 2013-04-27 DIAGNOSIS — N760 Acute vaginitis: Secondary | ICD-10-CM | POA: Insufficient documentation

## 2013-04-27 DIAGNOSIS — Q690 Accessory finger(s): Secondary | ICD-10-CM | POA: Insufficient documentation

## 2013-04-27 DIAGNOSIS — Z87891 Personal history of nicotine dependence: Secondary | ICD-10-CM | POA: Insufficient documentation

## 2013-04-27 DIAGNOSIS — A499 Bacterial infection, unspecified: Secondary | ICD-10-CM | POA: Insufficient documentation

## 2013-04-27 DIAGNOSIS — B9689 Other specified bacterial agents as the cause of diseases classified elsewhere: Secondary | ICD-10-CM | POA: Insufficient documentation

## 2013-04-27 LAB — CBC WITH DIFFERENTIAL/PLATELET
Basophils Absolute: 0 K/uL (ref 0.0–0.1)
Basophils Relative: 0 % (ref 0–1)
Eosinophils Absolute: 0.1 K/uL (ref 0.0–0.7)
Eosinophils Relative: 2 % (ref 0–5)
HCT: 38.9 % (ref 36.0–46.0)
Hemoglobin: 13.5 g/dL (ref 12.0–15.0)
Lymphocytes Relative: 50 % — ABNORMAL HIGH (ref 12–46)
Lymphs Abs: 2.2 K/uL (ref 0.7–4.0)
MCH: 31.2 pg (ref 26.0–34.0)
MCHC: 34.7 g/dL (ref 30.0–36.0)
MCV: 89.8 fL (ref 78.0–100.0)
Monocytes Absolute: 0.3 K/uL (ref 0.1–1.0)
Monocytes Relative: 7 % (ref 3–12)
Neutro Abs: 1.8 K/uL (ref 1.7–7.7)
Neutrophils Relative %: 41 % — ABNORMAL LOW (ref 43–77)
Platelets: 243 K/uL (ref 150–400)
RBC: 4.33 MIL/uL (ref 3.87–5.11)
RDW: 12.5 % (ref 11.5–15.5)
WBC: 4.4 K/uL (ref 4.0–10.5)

## 2013-04-27 LAB — URINALYSIS, ROUTINE W REFLEX MICROSCOPIC
Bilirubin Urine: NEGATIVE
Glucose, UA: NEGATIVE mg/dL
Hgb urine dipstick: NEGATIVE
Ketones, ur: NEGATIVE mg/dL
Nitrite: NEGATIVE
Protein, ur: NEGATIVE mg/dL
Specific Gravity, Urine: 1.033 — ABNORMAL HIGH (ref 1.005–1.030)
Urobilinogen, UA: 0.2 mg/dL (ref 0.0–1.0)
pH: 5.5 (ref 5.0–8.0)

## 2013-04-27 LAB — COMPREHENSIVE METABOLIC PANEL
ALK PHOS: 31 U/L — AB (ref 39–117)
ALT: 8 U/L (ref 0–35)
AST: 17 U/L (ref 0–37)
Albumin: 3.9 g/dL (ref 3.5–5.2)
BUN: 12 mg/dL (ref 6–23)
CO2: 26 mEq/L (ref 19–32)
Calcium: 9.5 mg/dL (ref 8.4–10.5)
Chloride: 101 mEq/L (ref 96–112)
Creatinine, Ser: 0.81 mg/dL (ref 0.50–1.10)
GFR calc non Af Amer: >90 mL/min (ref >90)
GLUCOSE: 68 mg/dL — AB (ref 70–99)
POTASSIUM: 3.6 meq/L — AB (ref 3.7–5.3)
SODIUM: 137 meq/L (ref 137–147)
TOTAL PROTEIN: 7.5 g/dL (ref 6.0–8.3)
Total Bilirubin: 0.4 mg/dL (ref 0.3–1.2)

## 2013-04-27 LAB — URINE MICROSCOPIC-ADD ON

## 2013-04-27 LAB — WET PREP, GENITAL
Trich, Wet Prep: NONE SEEN
Yeast Wet Prep HPF POC: NONE SEEN

## 2013-04-27 LAB — POCT PREGNANCY, URINE: Preg Test, Ur: NEGATIVE

## 2013-04-27 MED ORDER — METRONIDAZOLE 500 MG PO TABS: 500.0000 mg | ORAL_TABLET | Freq: Once | ORAL | Status: DC

## 2013-04-27 MED ORDER — METRONIDAZOLE 500 MG PO TABS: 500.0000 mg | ORAL_TABLET | Freq: Two times a day (BID) | ORAL | Status: DC

## 2013-04-27 NOTE — ED Notes (Signed)
abd pain and vag d/c since early jan

## 2013-04-27 NOTE — ED Notes (Signed)
Pt. Refused her Flagyl.  Pt. Stated, "I am going out tonight drinking.  I can start it tomorrow."  Reported to Dr. Patria Maneampos.

## 2013-04-28 LAB — GC/CHLAMYDIA PROBE AMP
CT PROBE, AMP APTIMA: NEGATIVE
GC PROBE AMP APTIMA: NEGATIVE

## 2013-04-28 NOTE — ED Provider Notes (Signed)
CSN: [id number]     Arrival date & time 04/27/13  1406 History   First MD Initiated Contact with Patient 04/27/13 1503     Chief Complaint  Patient presents with  . Abdominal Pain   (Consider location/radiation/quality/duration/timing/severity/associated sxs/prior Treatment) HPI Patient reports ongoing vaginal discharge over the past several weeks.  Nausea vomiting.  No fevers or chills.  She is concerned that her sexual partner may have been "cheating".  She denies abdominal pain.  Her symptoms are mild in severity.  Nothing worsens or improves her symptoms.   Past Medical History  Diagnosis Date  . Chlamydia   . Polydactyly of fingers   . Abnormal Pap smear 2010  . Peanut allergy   . Rh negative status during pregnancy     Received Rhophylac at 29 weeks.   Past Surgical History  Procedure Laterality Date  . Wisdom tooth extraction    . Root canal     No family history on file. History  Substance Use Topics  . Smoking status: Former Games developermoker  . Smokeless tobacco: Never Used  . Alcohol Use: No   OB History   Grav Para Term Preterm Abortions TAB SAB Ect Mult Living   1 1 1       1      Review of Systems  All other systems reviewed and are negative.    Allergies  Hazel tree pollen and Peanuts  Home Medications   Current Outpatient Rx  Name  Route  Sig  Dispense  Refill  . Biotin 10 MG TABS   Oral   Take 10 mg by mouth daily.         Marland Kitchen. etonogestrel (NEXPLANON) 68 MG IMPL implant   Subcutaneous   Inject 1 each into the skin once.         . metroNIDAZOLE (FLAGYL) 500 MG tablet   Oral   Take 1 tablet (500 mg total) by mouth 2 (two) times daily.   14 tablet   0    BP 99/76  Pulse 98  Temp(Src) 97.7 F (36.5 C) (Oral)  Resp 18  Ht 5\' 1"  (1.549 m)  Wt 96 lb (43.545 kg)  BMI 18.15 kg/m2  SpO2 100% Physical Exam  Nursing note and vitals reviewed. Constitutional: She is oriented to person, place, and time. She appears well-developed and well-nourished.  No distress.  HENT:  Head: Normocephalic and atraumatic.  Eyes: EOM are normal.  Neck: Normal range of motion.  Cardiovascular: Normal rate, regular rhythm and normal heart sounds.   Pulmonary/Chest: Effort normal and breath sounds normal.  Abdominal: Soft. She exhibits no distension. There is no tenderness.  Genitourinary:  The cervical motion tenderness.  No cervical erythema or pus from her cervix.  No adnexal masses or fullness.  Normal external genitalia.  White discharge noted  Musculoskeletal: Normal range of motion.  Neurological: She is alert and oriented to person, place, and time.  Skin: Skin is warm and dry.  Psychiatric: She has a normal mood and affect. Judgment normal.    ED Course  Procedures (including critical care time) Labs Review Labs Reviewed  WET PREP, GENITAL - Abnormal; Notable for the following:    Clue Cells Wet Prep HPF POC MODERATE (*)    WBC, Wet Prep HPF POC FEW (*)    All other components within normal limits  CBC WITH DIFFERENTIAL - Abnormal; Notable for the following:    Neutrophils Relative % 41 (*)    Lymphocytes Relative 50 (*)  All other components within normal limits  COMPREHENSIVE METABOLIC PANEL - Abnormal; Notable for the following:    Potassium 3.6 (*)    Glucose, Bld 68 (*)    Alkaline Phosphatase 31 (*)    All other components within normal limits  URINALYSIS, ROUTINE W REFLEX MICROSCOPIC - Abnormal; Notable for the following:    APPearance CLOUDY (*)    Specific Gravity, Urine 1.033 (*)    Leukocytes, UA SMALL (*)    All other components within normal limits  URINE MICROSCOPIC-ADD ON - Abnormal; Notable for the following:    Squamous Epithelial / LPF MANY (*)    Bacteria, UA FEW (*)    All other components within normal limits  GC/CHLAMYDIA PROBE AMP  URINE CULTURE  POCT PREGNANCY, URINE   Imaging Review No results found.  EKG Interpretation   None       MDM   1. Bacterial vaginosis    Patient presented  bacterial vaginosis.  Nothing to suggest cervicitis or TOA.  Discharge home in good condition    Lyanne Co, MD 04/28/13 [phone]

## 2013-04-29 LAB — URINE CULTURE

## 2013-08-14 ENCOUNTER — Ambulatory Visit (INDEPENDENT_AMBULATORY_CARE_PROVIDER_SITE_OTHER): Payer: Medicaid Other | Admitting: General Surgery

## 2013-08-14 ENCOUNTER — Encounter (INDEPENDENT_AMBULATORY_CARE_PROVIDER_SITE_OTHER): Payer: Self-pay | Admitting: General Surgery

## 2013-08-14 VITALS — BP 122/70 | HR 73 | Temp 98.4°F | Ht 61.0 in | Wt 92.0 lb

## 2013-08-14 DIAGNOSIS — K429 Umbilical hernia without obstruction or gangrene: Secondary | ICD-10-CM | POA: Insufficient documentation

## 2013-08-14 NOTE — Progress Notes (Signed)
Patient ID: Linda Rivas, female   DOB: [birth year], 23 y.o.   MRN: [id number]  Chief Complaint  Patient presents with  . eval hernia    HPI Linda Rivas is a 23 y.o. female.  Symptomatic umbilical hernia   HPI The patient has developed an umbilical hernia since her pregnancy 8 months ago. It is symptomatic with some discomfort and protrusion. Past Medical History  Diagnosis Date  . Chlamydia   . Polydactyly of fingers   . Abnormal Pap smear 2010  . Peanut allergy   . Rh negative status during pregnancy     Received Rhophylac at 29 weeks.    Past Surgical History  Procedure Laterality Date  . Wisdom tooth extraction    . Root canal      History reviewed. No pertinent family history.  Social History History  Substance Use Topics  . Smoking status: Never Smoker   . Smokeless tobacco: Never Used  . Alcohol Use: Yes    Allergies  Allergen Reactions  . Hazel Tree Pollen [Corylus] Swelling  . Peanuts [Peanut Oil] Swelling    Current Outpatient Prescriptions  Medication Sig Dispense Refill  . Bee Pollen 1000 MG TABS Take by mouth.       No current facility-administered medications for this visit.    Review of Systems Review of Systems  Constitutional: Negative.   HENT: Negative.   Eyes: Negative.   Respiratory: Negative.   Gastrointestinal: Positive for abdominal pain.    Blood pressure 122/70, pulse 73, temperature 98.4 F (36.9 C), height 5\' 1"  (1.549 m), weight 92 lb (41.731 kg), unknown if currently breastfeeding.  Physical Exam Physical Exam  Constitutional: She is oriented to person, place, and time. She appears well-developed and well-nourished.  HENT:  Head: Normocephalic and atraumatic.  Eyes: Conjunctivae and EOM are normal. Pupils are equal, round, and reactive to light.  Neck: Normal range of motion. Neck supple.  Cardiovascular: Normal rate, regular rhythm, normal heart sounds and intact distal pulses.   Pulmonary/Chest: Effort normal and  breath sounds normal.  Abdominal: Soft. Normal appearance and bowel sounds are normal. There is tenderness. A hernia is present. Hernia confirmed positive in the ventral area (umbilical).  Musculoskeletal: Normal range of motion.  Neurological: She is alert and oriented to person, place, and time. She has normal reflexes.  Skin: Skin is warm and dry.  Psychiatric: She has a normal mood and affect. Her behavior is normal. Judgment and thought content normal.    Data Reviewed Notes from her referring physician's office have been reviewed.  Assessment    Easily reducible umbilical hernia.     Plan    Open umbilical hernia repair. Mesh would not be necessary. Outpatient procedure. Preoperative antibiotics.        Cherylynn RidgesJames O Cliffton Spradley 08/14/2013, 12:08 PM

## 2013-08-15 ENCOUNTER — Encounter (HOSPITAL_COMMUNITY): Payer: Self-pay | Admitting: Pharmacy Technician

## 2013-08-16 ENCOUNTER — Encounter (HOSPITAL_COMMUNITY)
Admission: RE | Admit: 2013-08-16 | Discharge: 2013-08-16 | Disposition: A | Discharge status: Home / Self Care | Payer: Medicaid Other | Source: Ambulatory Visit | Attending: General Surgery | Admitting: General Surgery

## 2013-08-16 ENCOUNTER — Encounter (HOSPITAL_COMMUNITY): Payer: Self-pay

## 2013-08-16 DIAGNOSIS — Z01812 Encounter for preprocedural laboratory examination: Secondary | ICD-10-CM | POA: Insufficient documentation

## 2013-08-16 LAB — CBC
HEMATOCRIT: 36.3 % (ref 36.0–46.0)
Hemoglobin: 12.5 g/dL (ref 12.0–15.0)
MCH: 31.2 pg (ref 26.0–34.0)
MCHC: 34.4 g/dL (ref 30.0–36.0)
MCV: 90.5 fL (ref 78.0–100.0)
PLATELETS: 258 10*3/uL (ref 150–400)
RBC: 4.01 MIL/uL (ref 3.87–5.11)
RDW: 13 % (ref 11.5–15.5)
WBC: 4.8 10*3/uL (ref 4.0–10.5)

## 2013-08-16 LAB — HCG, SERUM, QUALITATIVE: Preg, Serum: NEGATIVE

## 2013-08-16 NOTE — Pre-Procedure Instructions (Signed)
Linda Rivas  08/16/2013   Your procedure is scheduled on:  Wednesday, May 27.  [facility]mitting at 8:00AM.  Call this number if you have problems the morning of surgery: [phone]   Remember:   Do not eat food or drink liquids after midnight Tuesday, May 26.   Take these medicines the morning of surgery with A SIP OF WATER: Levonorgestrel-Ethinyl Estradiol (SEASONIQUE).    Do not wear jewelry, make-up or nail polish.  Do not wear lotions, powders, or perfumes.  Do not shave 48 hours prior to surgery.  Do not bring valuables to the hospital.              [facility] not [facility]  for any belongings or valuables.               Contacts, dentures or bridgework may not be worn into surgery.  Leave suitcase in the car. After surgery it may be brought to your room.  For patients admitted to the hospital, discharge time is determined by your treatment team.               Patients discharged the day of surgery will not be allowed to drive home.  Name and phone number of your driver: -   Special Instructions: Review  [facility] - Preparing For Surgery.   Please read over the following fact sheets that you were given: Pain Booklet, Coughing and Deep Breathing and Surgical Site Infection Prevention

## 2013-08-21 MED ORDER — CHLORHEXIDINE GLUCONATE 4 % EX LIQD
1.0000 "application " | Freq: Once | CUTANEOUS | Status: DC
  Filled 2013-08-21: qty 15

## 2013-08-21 MED ORDER — DEXTROSE 5 % IV SOLN
2.0000 g | INTRAVENOUS | Status: AC
  Administered 2013-08-22: 2 g via INTRAVENOUS
  Filled 2013-08-21: qty 2

## 2013-08-22 ENCOUNTER — Encounter (HOSPITAL_COMMUNITY)
Admission: RE | Disposition: A | Discharge status: Home / Self Care | Payer: Self-pay | Source: Ambulatory Visit | Attending: General Surgery

## 2013-08-22 ENCOUNTER — Encounter (HOSPITAL_COMMUNITY): Payer: Self-pay | Admitting: *Deleted

## 2013-08-22 ENCOUNTER — Ambulatory Visit (HOSPITAL_COMMUNITY)
Admission: RE | Admit: 2013-08-22 | Discharge: 2013-08-22 | Disposition: A | Discharge status: Home / Self Care | Payer: Medicaid Other | Source: Ambulatory Visit | Attending: General Surgery | Admitting: General Surgery

## 2013-08-22 ENCOUNTER — Encounter (HOSPITAL_COMMUNITY): Payer: Medicaid Other | Admitting: Certified Registered Nurse Anesthetist

## 2013-08-22 ENCOUNTER — Ambulatory Visit (HOSPITAL_COMMUNITY): Payer: Medicaid Other | Admitting: Certified Registered Nurse Anesthetist

## 2013-08-22 DIAGNOSIS — K429 Umbilical hernia without obstruction or gangrene: Secondary | ICD-10-CM

## 2013-08-22 DIAGNOSIS — F17200 Nicotine dependence, unspecified, uncomplicated: Secondary | ICD-10-CM | POA: Insufficient documentation

## 2013-08-22 DIAGNOSIS — Z8619 Personal history of other infectious and parasitic diseases: Secondary | ICD-10-CM | POA: Insufficient documentation

## 2013-08-22 DIAGNOSIS — Z91010 Allergy to peanuts: Secondary | ICD-10-CM | POA: Insufficient documentation

## 2013-08-22 HISTORY — PX: UMBILICAL HERNIA REPAIR: SHX196

## 2013-08-22 SURGERY — REPAIR, HERNIA, UMBILICAL, ADULT
Anesthesia: General | Site: Abdomen

## 2013-08-22 MED ORDER — GLYCOPYRROLATE 0.2 MG/ML IJ SOLN
INTRAMUSCULAR | Status: AC
Start: 2013-08-22 — End: 2013-08-22
  Filled 2013-08-22: qty 2

## 2013-08-22 MED ORDER — GLYCOPYRROLATE 0.2 MG/ML IJ SOLN
INTRAMUSCULAR | Status: DC | PRN
  Administered 2013-08-22: .4 mg via INTRAVENOUS
  Administered 2013-08-22: .1 mg via INTRAVENOUS

## 2013-08-22 MED ORDER — KETOROLAC TROMETHAMINE 30 MG/ML IJ SOLN
15.0000 mg | Freq: Once | INTRAMUSCULAR | Status: AC | PRN
  Administered 2013-08-22: 15 mg via INTRAVENOUS

## 2013-08-22 MED ORDER — OXYCODONE HCL 5 MG/5ML PO SOLN: 5.0000 mg | Freq: Once | ORAL | Status: AC | PRN

## 2013-08-22 MED ORDER — LIDOCAINE HCL (PF) 1 % IJ SOLN
INTRAMUSCULAR | Status: AC
  Filled 2013-08-22: qty 30

## 2013-08-22 MED ORDER — FENTANYL CITRATE 0.05 MG/ML IJ SOLN
INTRAMUSCULAR | Status: AC
  Filled 2013-08-22: qty 5

## 2013-08-22 MED ORDER — ARTIFICIAL TEARS OP OINT
TOPICAL_OINTMENT | OPHTHALMIC | Status: DC | PRN
  Administered 2013-08-22: 1 via OPHTHALMIC

## 2013-08-22 MED ORDER — PROMETHAZINE HCL 25 MG/ML IJ SOLN: 6.2500 mg | INTRAMUSCULAR | Status: DC | PRN

## 2013-08-22 MED ORDER — BUPIVACAINE HCL (PF) 0.25 % IJ SOLN
INTRAMUSCULAR | Status: AC
  Filled 2013-08-22: qty 30

## 2013-08-22 MED ORDER — NEOSTIGMINE METHYLSULFATE 10 MG/10ML IV SOLN
INTRAVENOUS | Status: AC
  Filled 2013-08-22: qty 1

## 2013-08-22 MED ORDER — LACTATED RINGERS IV SOLN
INTRAVENOUS | Status: DC | PRN
  Administered 2013-08-22: 10:00:00 via INTRAVENOUS

## 2013-08-22 MED ORDER — FENTANYL CITRATE 0.05 MG/ML IJ SOLN
25.0000 ug | INTRAMUSCULAR | Status: DC | PRN
  Administered 2013-08-22: 50 ug via INTRAVENOUS

## 2013-08-22 MED ORDER — ROCURONIUM BROMIDE 50 MG/5ML IV SOLN
INTRAVENOUS | Status: AC
  Filled 2013-08-22: qty 1

## 2013-08-22 MED ORDER — NEOSTIGMINE METHYLSULFATE 10 MG/10ML IV SOLN
INTRAVENOUS | Status: DC | PRN
  Administered 2013-08-22: 1 mg via INTRAVENOUS
  Administered 2013-08-22: 3 mg via INTRAVENOUS

## 2013-08-22 MED ORDER — ONDANSETRON HCL 4 MG/2ML IJ SOLN
INTRAMUSCULAR | Status: DC | PRN
  Administered 2013-08-22: 4 mg via INTRAVENOUS

## 2013-08-22 MED ORDER — OXYCODONE HCL 5 MG PO TABS
ORAL_TABLET | ORAL | Status: AC
  Filled 2013-08-22: qty 1

## 2013-08-22 MED ORDER — FENTANYL CITRATE 0.05 MG/ML IJ SOLN
INTRAMUSCULAR | Status: AC
  Filled 2013-08-22: qty 2

## 2013-08-22 MED ORDER — DEXAMETHASONE SODIUM PHOSPHATE 4 MG/ML IJ SOLN
INTRAMUSCULAR | Status: AC
  Filled 2013-08-22: qty 2

## 2013-08-22 MED ORDER — SODIUM CHLORIDE 0.9 % IR SOLN
Status: DC | PRN
  Administered 2013-08-22: 11:00:00

## 2013-08-22 MED ORDER — ONDANSETRON HCL 4 MG/2ML IJ SOLN
INTRAMUSCULAR | Status: AC
  Filled 2013-08-22: qty 2

## 2013-08-22 MED ORDER — ACETAMINOPHEN 160 MG/5ML PO SOLN
325.0000 mg | ORAL | Status: DC | PRN
  Filled 2013-08-22: qty 20.3

## 2013-08-22 MED ORDER — ARTIFICIAL TEARS OP OINT
TOPICAL_OINTMENT | OPHTHALMIC | Status: AC
  Filled 2013-08-22: qty 3.5

## 2013-08-22 MED ORDER — PROPOFOL 10 MG/ML IV BOLUS
INTRAVENOUS | Status: AC
  Filled 2013-08-22: qty 20

## 2013-08-22 MED ORDER — TRAMADOL HCL 50 MG PO TABS: 50.0000 mg | ORAL_TABLET | Freq: Four times a day (QID) | ORAL | Status: DC | PRN

## 2013-08-22 MED ORDER — PROPOFOL 10 MG/ML IV BOLUS
INTRAVENOUS | Status: DC | PRN
  Administered 2013-08-22: 160 mg via INTRAVENOUS

## 2013-08-22 MED ORDER — LIDOCAINE HCL (CARDIAC) 20 MG/ML IV SOLN
INTRAVENOUS | Status: AC
  Filled 2013-08-22: qty 5

## 2013-08-22 MED ORDER — DEXAMETHASONE SODIUM PHOSPHATE 4 MG/ML IJ SOLN
INTRAMUSCULAR | Status: DC | PRN
  Administered 2013-08-22: 8 mg via INTRAVENOUS

## 2013-08-22 MED ORDER — FENTANYL CITRATE 0.05 MG/ML IJ SOLN
INTRAMUSCULAR | Status: DC | PRN
  Administered 2013-08-22: 50 ug via INTRAVENOUS
  Administered 2013-08-22: 150 ug via INTRAVENOUS
  Administered 2013-08-22: 25 ug via INTRAVENOUS

## 2013-08-22 MED ORDER — DIPHENHYDRAMINE HCL 50 MG/ML IJ SOLN
12.5000 mg | Freq: Once | INTRAMUSCULAR | Status: AC
  Administered 2013-08-22: 12.5 mg via INTRAVENOUS

## 2013-08-22 MED ORDER — MIDAZOLAM HCL 5 MG/5ML IJ SOLN
INTRAMUSCULAR | Status: DC | PRN
  Administered 2013-08-22: 2 mg via INTRAVENOUS

## 2013-08-22 MED ORDER — LIDOCAINE HCL (CARDIAC) 20 MG/ML IV SOLN
INTRAVENOUS | Status: DC | PRN
  Administered 2013-08-22: 60 mg via INTRAVENOUS

## 2013-08-22 MED ORDER — OXYCODONE HCL 5 MG PO TABS
5.0000 mg | ORAL_TABLET | Freq: Once | ORAL | Status: AC | PRN
  Administered 2013-08-22: 5 mg via ORAL

## 2013-08-22 MED ORDER — ACETAMINOPHEN 325 MG PO TABS: 325.0000 mg | ORAL_TABLET | ORAL | Status: DC | PRN

## 2013-08-22 MED ORDER — LACTATED RINGERS IV SOLN
INTRAVENOUS | Status: DC
  Administered 2013-08-22: 09:00:00 via INTRAVENOUS

## 2013-08-22 MED ORDER — ROCURONIUM BROMIDE 100 MG/10ML IV SOLN
INTRAVENOUS | Status: DC | PRN
  Administered 2013-08-22: 30 mg via INTRAVENOUS

## 2013-08-22 MED ORDER — DIPHENHYDRAMINE HCL 50 MG/ML IJ SOLN
INTRAMUSCULAR | Status: AC
  Filled 2013-08-22: qty 1

## 2013-08-22 MED ORDER — BUPIVACAINE HCL (PF) 0.25 % IJ SOLN
INTRAMUSCULAR | Status: DC | PRN
  Administered 2013-08-22: 6 mL

## 2013-08-22 MED ORDER — MIDAZOLAM HCL 2 MG/2ML IJ SOLN
INTRAMUSCULAR | Status: AC
  Filled 2013-08-22: qty 2

## 2013-08-22 MED ORDER — LIDOCAINE HCL (PF) 1 % IJ SOLN
INTRAMUSCULAR | Status: DC | PRN
  Administered 2013-08-22: 30 mL via INTRADERMAL

## 2013-08-22 MED ORDER — KETOROLAC TROMETHAMINE 15 MG/ML IJ SOLN
INTRAMUSCULAR | Status: AC
  Filled 2013-08-22: qty 1

## 2013-08-22 SURGICAL SUPPLY — 54 items
ADH SKN CLS APL DERMABOND .7 (GAUZE/BANDAGES/DRESSINGS) ×1
BAG DECANTER FOR FLEXI CONT (MISCELLANEOUS) ×2 IMPLANT
BLADE SURG 10 STRL SS (BLADE) ×1 IMPLANT
BLADE SURG 15 STRL LF DISP TIS (BLADE) IMPLANT
BLADE SURG 15 STRL SS (BLADE) ×2
BLADE SURG ROTATE 9660 (MISCELLANEOUS) IMPLANT
CANISTER SUCTION 2500CC (MISCELLANEOUS) IMPLANT
CHLORAPREP W/TINT 26ML (MISCELLANEOUS) ×2 IMPLANT
CLEANER TIP ELECTROSURG 2X2 (MISCELLANEOUS) ×2 IMPLANT
COVER SURGICAL LIGHT HANDLE (MISCELLANEOUS) ×2 IMPLANT
DECANTER SPIKE VIAL GLASS SM (MISCELLANEOUS) ×1 IMPLANT
DERMABOND ADVANCED (GAUZE/BANDAGES/DRESSINGS) ×1
DERMABOND ADVANCED .7 DNX12 (GAUZE/BANDAGES/DRESSINGS) ×1 IMPLANT
DRAPE LAPAROTOMY TRNSV 102X78 (DRAPE) ×2 IMPLANT
DRAPE UTILITY 15X26 W/TAPE STR (DRAPE) ×4 IMPLANT
DRSG TEGADERM 2-3/8X2-3/4 SM (GAUZE/BANDAGES/DRESSINGS) ×1 IMPLANT
ELECT REM PT RETURN 9FT ADLT (ELECTROSURGICAL) ×2
ELECTRODE REM PT RTRN 9FT ADLT (ELECTROSURGICAL) ×1 IMPLANT
GAUZE SPONGE 4X4 16PLY XRAY LF (GAUZE/BANDAGES/DRESSINGS) ×1 IMPLANT
GLOVE BIO SURGEON STRL SZ7.5 (GLOVE) ×1 IMPLANT
GLOVE BIOGEL PI IND STRL 7.0 (GLOVE) IMPLANT
GLOVE BIOGEL PI IND STRL 8 (GLOVE) ×1 IMPLANT
GLOVE BIOGEL PI INDICATOR 7.0 (GLOVE) ×1
GLOVE BIOGEL PI INDICATOR 8 (GLOVE) ×1
GLOVE ECLIPSE 7.5 STRL STRAW (GLOVE) ×2 IMPLANT
GLOVE SS BIOGEL STRL SZ 6.5 (GLOVE) IMPLANT
GLOVE SUPERSENSE BIOGEL SZ 6.5 (GLOVE) ×1
GLOVE SURG SS PI 7.0 STRL IVOR (GLOVE) ×1 IMPLANT
GOWN STRL REUS W/ TWL LRG LVL3 (GOWN DISPOSABLE) ×2 IMPLANT
GOWN STRL REUS W/TWL LRG LVL3 (GOWN DISPOSABLE) ×4
KIT BASIN OR (CUSTOM PROCEDURE TRAY) ×2 IMPLANT
KIT ROOM TURNOVER OR (KITS) ×2 IMPLANT
NDL HYPO 25GX1X1/2 BEV (NEEDLE) ×1 IMPLANT
NEEDLE HYPO 25GX1X1/2 BEV (NEEDLE) ×2 IMPLANT
NS IRRIG 1000ML POUR BTL (IV SOLUTION) ×2 IMPLANT
PACK SURGICAL SETUP 50X90 (CUSTOM PROCEDURE TRAY) ×2 IMPLANT
PAD ARMBOARD 7.5X6 YLW CONV (MISCELLANEOUS) ×2 IMPLANT
PENCIL BUTTON HOLSTER BLD 10FT (ELECTRODE) ×2 IMPLANT
SPONGE GAUZE 4X4 12PLY (GAUZE/BANDAGES/DRESSINGS) ×2 IMPLANT
SPONGE INTESTINAL PEANUT (DISPOSABLE) ×2 IMPLANT
SPONGE LAP 18X18 X RAY DECT (DISPOSABLE) ×2 IMPLANT
STAPLER VISISTAT 35W (STAPLE) IMPLANT
STRIP CLOSURE SKIN 1/2X4 (GAUZE/BANDAGES/DRESSINGS) ×2 IMPLANT
SUT MNCRL AB 4-0 PS2 18 (SUTURE) ×2 IMPLANT
SUT NOVA NAB GS-21 0 18 T12 DT (SUTURE) ×2 IMPLANT
SUT VIC AB 3-0 SH 27 (SUTURE) ×2
SUT VIC AB 3-0 SH 27X BRD (SUTURE) ×1 IMPLANT
SUT VICRYL AB 3 0 TIES (SUTURE) ×2 IMPLANT
SYR BULB 3OZ (MISCELLANEOUS) ×2 IMPLANT
SYR CONTROL 10ML LL (SYRINGE) ×2 IMPLANT
TOWEL OR 17X24 6PK STRL BLUE (TOWEL DISPOSABLE) ×2 IMPLANT
TOWEL OR 17X26 10 PK STRL BLUE (TOWEL DISPOSABLE) ×2 IMPLANT
TUBE CONNECTING 12X1/4 (SUCTIONS) IMPLANT
YANKAUER SUCT BULB TIP NO VENT (SUCTIONS) IMPLANT

## 2013-08-22 NOTE — Discharge Instructions (Signed)
Umbilical Herniorrhaphy °Care After °Refer to this sheet in the next few weeks. These instructions provide you with information on caring for yourself after your procedure. Your caregiver may also give you more specific instructions. Your treatment has been planned according to current medical practices, but problems sometimes occur. Call your caregiver if you have any problems or questions after your procedure. °HOME CARE INSTRUCTIONS °· If you are given antibiotic medicine, take it as directed. Finish it even if you start to feel better. °· Only take over-the-counter or prescription medicines for pain, fever, or discomfort as directed by your caregiver. Do not take aspirin. It can cause bleeding. °· Do not get your surgical cut (incision) area wet unless your caregiver says it is okay. °· Avoid lifting objects heavier than 10 lb (4.5 kg) for 8 weeks after surgery. °· Avoid sexual activity for 5 weeks after surgery or as directed by your caregiver. °· Do not drive while taking prescription pain medicine. °· You may return to your other normal, daily activities after 3 days or as directed by your caregiver. °SEEK MEDICAL CARE IF: °· You notice blood or fluid leaking from the surgical site. °· Your incision area becomes red or swollen. °· Your pain at the surgical site becomes worse or is not relieved by medicine. °· You have problems urinating. °· You feel nauseous or vomit more than 2 days after surgery. °· You notice the bulge in your abdomen returns after the procedure. °SEEK IMMEDIATE MEDICAL CARE IF: °· You have a fever. °· You have nausea or vomiting that will not stop. °MAKE SURE YOU: °· Understand these instructions. °· Will watch your condition. °· Will get help right away if you are not doing well or get worse. °Document Released: 09/14/2011 Document Reviewed: 09/14/2011 °[employer]® Patient Information ©2014 [employer]. ° °

## 2013-08-22 NOTE — Op Note (Signed)
OPERATIVE REPORT  DATE OF OPERATION: 08/22/2013  PATIENT:  Linda Rivas  23 y.o. female  PRE-OPERATIVE DIAGNOSIS:  umbilical hernia  POST-OPERATIVE DIAGNOSIS:  umbilical hernia  PROCEDURE:  Procedure(s): HERNIA REPAIR UMBILICAL   SURGEON:  Surgeon(s): Cherylynn Ridges, MD  ASSISTANT: None  ANESTHESIA:   general  EBL: <20 ml  BLOOD ADMINISTERED: none  DRAINS: none   SPECIMEN:  No Specimen  COUNTS CORRECT:  YES  PROCEDURE DETAILS: The patient was taken to the operating room and placed on the table in supine position. After an adequate general endotracheal anesthetic was administered she was prepped and draped in usual sterile manner exposing her entire abdomen. A proper timeout was performed identifying the patient and the procedure to be performed. A supraumbilical 3-1/2 cm transverse incision was made using a #15 blade. We dissected down into the subcutaneous tissue using electrocautery then dissected the hernia sac away from the umbilicus. We found the edges of the hernia sac and resected the sac. This exposed the fascial edges circumferentially.  We reapproximated the fascia using interrupted 0 Novafil sutures. A total of 10 sutures were used. Once this was done we inverted the umbilicus back into the incision using a 3-0 Vicryl attaching it to the fascia. We then reapproximated the subcutaneous tissue using interrupted 3-0 Vicryl suture. I injected Quarter percent Marcaine without epinephrine into the subcutaneous tissue. We reapproximated the skin using running subcuticular stitch of 4-0 Monocryl.  All counts were correct including needles, sponges, and instrument. Dermabond Steri-Strips and Tegaderm were used to complete the dressing.  PATIENT DISPOSITION:  PACU - hemodynamically stable.   Cherylynn Ridges 5/27/201511:42 AM

## 2013-08-22 NOTE — H&P (View-Only) (Signed)
Patient ID: Linda Rivas, female   DOB: [birth year], 23 y.o.   MRN: [id number]  Chief Complaint  Patient presents with  . eval hernia    HPI Linda Rivas is a 23 y.o. female.  Symptomatic umbilical hernia   HPI The patient has developed an umbilical hernia since her pregnancy 8 months ago. It is symptomatic with some discomfort and protrusion. Past Medical History  Diagnosis Date  . Chlamydia   . Polydactyly of fingers   . Abnormal Pap smear 2010  . Peanut allergy   . Rh negative status during pregnancy     Received Rhophylac at 29 weeks.    Past Surgical History  Procedure Laterality Date  . Wisdom tooth extraction    . Root canal      History reviewed. No pertinent family history.  Social History History  Substance Use Topics  . Smoking status: Never Smoker   . Smokeless tobacco: Never Used  . Alcohol Use: Yes    Allergies  Allergen Reactions  . Hazel Tree Pollen [Corylus] Swelling  . Peanuts [Peanut Oil] Swelling    Current Outpatient Prescriptions  Medication Sig Dispense Refill  . Bee Pollen 1000 MG TABS Take by mouth.       No current facility-administered medications for this visit.    Review of Systems Review of Systems  Constitutional: Negative.   HENT: Negative.   Eyes: Negative.   Respiratory: Negative.   Gastrointestinal: Positive for abdominal pain.    Blood pressure 122/70, pulse 73, temperature 98.4 F (36.9 C), height 5\' 1"  (1.549 m), weight 92 lb (41.731 kg), unknown if currently breastfeeding.  Physical Exam Physical Exam  Constitutional: She is oriented to person, place, and time. She appears well-developed and well-nourished.  HENT:  Head: Normocephalic and atraumatic.  Eyes: Conjunctivae and EOM are normal. Pupils are equal, round, and reactive to light.  Neck: Normal range of motion. Neck supple.  Cardiovascular: Normal rate, regular rhythm, normal heart sounds and intact distal pulses.   Pulmonary/Chest: Effort normal and  breath sounds normal.  Abdominal: Soft. Normal appearance and bowel sounds are normal. There is tenderness. A hernia is present. Hernia confirmed positive in the ventral area (umbilical).  Musculoskeletal: Normal range of motion.  Neurological: She is alert and oriented to person, place, and time. She has normal reflexes.  Skin: Skin is warm and dry.  Psychiatric: She has a normal mood and affect. Her behavior is normal. Judgment and thought content normal.    Data Reviewed Notes from her referring physician's office have been reviewed.  Assessment    Easily reducible umbilical hernia.     Plan    Open umbilical hernia repair. Mesh would not be necessary. Outpatient procedure. Preoperative antibiotics.        Cherylynn Ridges 08/14/2013, 12:08 PM

## 2013-08-22 NOTE — Interval H&P Note (Signed)
History and Physical Interval Note: No interval changes.  Patient questions answered.  08/22/2013 11:37 AM  Linda Rivas  has presented today for surgery, with the diagnosis of umbilical hernia  The various methods of treatment have been discussed with the patient and family. After consideration of risks, benefits and other options for treatment, the patient has consented to  Procedure(s): HERNIA REPAIR UMBILICAL  (N/A) as a surgical intervention .  The patient's history has been reviewed, patient examined, no change in status, stable for surgery.  I have reviewed the patient's chart and labs.  Questions were answered to the patient's satisfaction.     Cherylynn Ridges

## 2013-08-22 NOTE — Anesthesia Postprocedure Evaluation (Signed)
  Anesthesia Post-op Note  Patient: Linda Rivas  Procedure(s) Performed: Procedure(s): HERNIA REPAIR UMBILICAL  (N/A)  Patient Location: PACU  Anesthesia Type:General  Level of Consciousness: awake and alert   Airway and Oxygen Therapy: Patient Spontanous Breathing  Post-op Pain: mild  Post-op Assessment: Post-op Vital signs reviewed, Patient's Cardiovascular Status Stable, Respiratory Function Stable, Patent Airway, No signs of Nausea or vomiting and Pain level controlled  Post-op Vital Signs: Reviewed and stable  Last Vitals:  Filed Vitals:   08/22/13 1355  BP: 110/66  Pulse: 65  Temp: 36.9 C  Resp: 16    Complications: No apparent anesthesia complications

## 2013-08-22 NOTE — Anesthesia Preprocedure Evaluation (Addendum)
Anesthesia Evaluation  Patient identified by MRN, date of birth, ID band Patient awake    Reviewed: Allergy & Precautions, H&P , NPO status , Patient's Chart, lab work & pertinent test results  History of Anesthesia Complications Negative for: history of anesthetic complications  Airway Mallampati: II TM Distance: >3 FB Neck ROM: Full    Dental  (+) Teeth Intact, Dental Advisory Given   Pulmonary neg sleep apnea, neg COPDneg recent URI, Current Smoker,  Social smoker breath sounds clear to auscultation        Cardiovascular negative cardio ROS  Rhythm:Regular     Neuro/Psych negative neurological ROS  negative psych ROS   GI/Hepatic negative GI ROS, Neg liver ROS,   Endo/Other  negative endocrine ROS  Renal/GU negative Renal ROS     Musculoskeletal negative musculoskeletal ROS (+)   Abdominal   Peds  Hematology negative hematology ROS (+)   Anesthesia Other Findings   Reproductive/Obstetrics negative OB ROS                          Anesthesia Physical Anesthesia Plan  ASA: II  Anesthesia Plan: General   Post-op Pain Management:    Induction: Intravenous  Airway Management Planned: Oral ETT  Additional Equipment: None  Intra-op Plan:   Post-operative Plan: Extubation in OR  Informed Consent: I have reviewed the patients History and Physical, chart, labs and discussed the procedure including the risks, benefits and alternatives for the proposed anesthesia with the patient or authorized representative who has indicated his/her understanding and acceptance.   Dental advisory given  Plan Discussed with: CRNA and Surgeon  Anesthesia Plan Comments:        Anesthesia Quick Evaluation

## 2013-08-22 NOTE — Transfer of Care (Signed)
Immediate Anesthesia Transfer of Care Note  Patient: Linda Rivas  Procedure(s) Performed: Procedure(s): HERNIA REPAIR UMBILICAL  (N/A)  Patient Location: PACU  Anesthesia Type:General  Level of Consciousness: awake, alert , oriented and patient cooperative  Airway & Oxygen Therapy: Patient Spontanous Breathing and Patient connected to nasal cannula oxygen  Post-op Assessment: Report given to PACU RN, Post -op Vital signs reviewed and stable and Patient moving all extremities X 4  Post vital signs: Reviewed and stable  Complications: No apparent anesthesia complications

## 2013-08-23 ENCOUNTER — Telehealth (INDEPENDENT_AMBULATORY_CARE_PROVIDER_SITE_OTHER): Payer: Self-pay

## 2013-08-23 ENCOUNTER — Encounter (HOSPITAL_COMMUNITY): Payer: Self-pay | Admitting: General Surgery

## 2013-08-23 ENCOUNTER — Other Ambulatory Visit (INDEPENDENT_AMBULATORY_CARE_PROVIDER_SITE_OTHER): Payer: Self-pay

## 2013-08-23 MED ORDER — HYDROCODONE-ACETAMINOPHEN 5-325 MG PO TABS: ORAL_TABLET | ORAL | Status: DC

## 2013-08-23 NOTE — Telephone Encounter (Signed)
Close encounter 

## 2013-08-23 NOTE — Telephone Encounter (Signed)
Pt p/u Rx for Norco 5/325 mg #30 approved by Dr. Lindie Spruce and signed by Dr. Daphine Deutscher.

## 2013-08-23 NOTE — Telephone Encounter (Signed)
Pt is post op umbilical repair on 08/21/13 by Dr. Lindie Spruce.  She was given Tramadol for pain, which she says is not helping much, especially at night.  Could she have something a little stronger.  Message sent to Dr. Lindie Spruce and we will call her back with his response.

## 2013-08-23 NOTE — Addendum Note (Signed)
Addendum created 08/23/13 1417 by Armandina Gemma   Modules edited: Anesthesia Medication Administration

## 2013-08-24 ENCOUNTER — Telehealth (INDEPENDENT_AMBULATORY_CARE_PROVIDER_SITE_OTHER): Payer: Self-pay

## 2013-08-24 NOTE — Telephone Encounter (Signed)
Pt is po umbilical hernia repair on 08/22/13 by Dr. Lindie Spruce.  She calls to report no BM since surgery.  She was given instructions to start taking Miralax along with a stool softeners, 2-3 times a day, stay well hydrated and up moving around.  If not BM this weekend to call us back.  Pt understood and agreed.

## 2013-08-25 ENCOUNTER — Telehealth (INDEPENDENT_AMBULATORY_CARE_PROVIDER_SITE_OTHER): Payer: Self-pay | Admitting: Surgery

## 2013-08-25 NOTE — Telephone Encounter (Signed)
Linda Rivas  06-15-1990 [id number]  Patient Care Team: No Pcp Per Patient as PCP - General (General Practice)  This patient is a 23 y.o.female who calls today for surgical evaluation.   Date of procedure/visit: 08/22/2013  Surgery: POST-OPERATIVE DIAGNOSIS: umbilical hernia   PROCEDURE: Procedure(s):  HERNIA REPAIR UMBILICAL   SURGEON: Surgeon(s):  Cherylynn RidgesJames O Wyatt, MD      Reason for call: Vomiting  Young female underwent elective umbilical hernia repair 2 days ago.  She has noted some nausea.  She felt constipated.  She tried MiraLAX and an enema.  She vomited after eating dinner tonight.  Was concern.  Called.  Is at home with her parents trying to take care of her child.  Has tried to minimize using narcotics.  Was tolerating some liquids.  Not nauseated now.  I recommend she switched liquids only.  Maximize nonnarcotic pain with Tylenol and ice.  Consider enema or suppository to help out.  She said she did try an enema once but did note she didn't know if she did it right - no result.  She sounded a little stressed taking care of her crying child on the phone but not toxic.  She seemed consolable.  I offered to call in nausea medication.  She wanted to hold off for now.  I recommend she use liquids only.  Walk more.  See if things if a suppository or another enema will open things up.  If she worsens, call back.  If she can keep anything down, she may need to go to the emergency room to help things out.  She Seem distracting with her crying infant but after repeating things, she claimed understanding and expressed appreciation.  Patient Active Problem List   Diagnosis Date Noted  . Umbilical hernia 08/14/2013  . Vaginal delivery 12/06/2012  . IUGR (intrauterine growth restriction) 12/05/2012  . 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 Rh negative status during pregnancy     Past Medical History  Diagnosis Date  . Chlamydia   .  Polydactyly of fingers   . Abnormal Pap smear 2010  . Peanut allergy   . Rh negative status during pregnancy     Received Rhophylac at 29 weeks.    Past Surgical History  Procedure Laterality Date  . Wisdom tooth extraction    . Root canal    . Umbilical hernia repair N/A 08/22/2013    Procedure: HERNIA REPAIR UMBILICAL ;  Surgeon: Cherylynn RidgesJames O Wyatt, MD;  [facility];  Service: General;  Laterality: N/A;    History   Social History  . Marital Status: Single    Spouse Name: N/A    Number of Children: N/A  . Years of Education: N/A   Occupational History  . Not on file.   Social History Main Topics  . Smoking status: Never Smoker   . Smokeless tobacco: Never Used  . Alcohol Use: Yes     Comment: 2- 3 times a month   . Drug Use: No  . Sexual Activity: Yes   Other Topics Concern  . Not on file   Social History Narrative  . No narrative on file    No family history on file.  Current Outpatient Prescriptions  Medication Sig Dispense Refill  . Bee Pollen 1000 MG TABS Take 1,000 mg by mouth 2 (two) times a week.       Marland Kitchen. HYDROcodone-acetaminophen (NORCO) 5-325 MG per tablet 1-2 tablets every 4-6 hrs as  needed for pain.  30 tablet  0  . Levonorgestrel-Ethinyl Estradiol (SEASONIQUE) 0.15-0.03 &0.01 MG tablet Take 1 tablet by mouth daily.      . traMADol (ULTRAM) 50 MG tablet Take 1 tablet (50 mg total) by mouth every 6 (six) hours as needed.  30 tablet  0   No current facility-administered medications for this visit.     Allergies  Allergen Reactions  . Peanuts [Peanut Oil] Swelling    @VS @  No results found.  Note: This dictation was prepared with Dragon/digital dictation along with [employer]. Any transcriptional errors that result from this process are unintentional.

## 2013-08-29 NOTE — Telephone Encounter (Addendum)
Called pt back to check on her. Pt states she is feeling good and her symptoms have resolved.

## 2013-09-04 ENCOUNTER — Ambulatory Visit (INDEPENDENT_AMBULATORY_CARE_PROVIDER_SITE_OTHER): Payer: Medicaid Other | Admitting: General Surgery

## 2013-09-04 ENCOUNTER — Emergency Department (HOSPITAL_COMMUNITY)
Admission: EM | Admit: 2013-09-04 | Discharge: 2013-09-04 | Disposition: A | Discharge status: Home / Self Care | Payer: No Typology Code available for payment source | Attending: Emergency Medicine | Admitting: Emergency Medicine

## 2013-09-04 ENCOUNTER — Encounter (HOSPITAL_COMMUNITY): Payer: Self-pay | Admitting: Emergency Medicine

## 2013-09-04 ENCOUNTER — Encounter (INDEPENDENT_AMBULATORY_CARE_PROVIDER_SITE_OTHER): Payer: Self-pay | Admitting: General Surgery

## 2013-09-04 VITALS — BP 118/70 | HR 78 | Temp 97.5°F | Ht 61.0 in | Wt 94.0 lb

## 2013-09-04 DIAGNOSIS — Z79899 Other long term (current) drug therapy: Secondary | ICD-10-CM | POA: Insufficient documentation

## 2013-09-04 DIAGNOSIS — S161XXA Strain of muscle, fascia and tendon at neck level, initial encounter: Secondary | ICD-10-CM

## 2013-09-04 DIAGNOSIS — IMO0002 Reserved for concepts with insufficient information to code with codable children: Secondary | ICD-10-CM | POA: Insufficient documentation

## 2013-09-04 DIAGNOSIS — K645 Perianal venous thrombosis: Secondary | ICD-10-CM

## 2013-09-04 DIAGNOSIS — Y92410 Unspecified street and highway as the place of occurrence of the external cause: Secondary | ICD-10-CM | POA: Insufficient documentation

## 2013-09-04 DIAGNOSIS — Z09 Encounter for follow-up examination after completed treatment for conditions other than malignant neoplasm: Secondary | ICD-10-CM | POA: Insufficient documentation

## 2013-09-04 DIAGNOSIS — Y9389 Activity, other specified: Secondary | ICD-10-CM | POA: Insufficient documentation

## 2013-09-04 DIAGNOSIS — S199XXA Unspecified injury of neck, initial encounter: Principal | ICD-10-CM

## 2013-09-04 DIAGNOSIS — S0993XA Unspecified injury of face, initial encounter: Secondary | ICD-10-CM | POA: Insufficient documentation

## 2013-09-04 MED ORDER — CYCLOBENZAPRINE HCL 10 MG PO TABS: 10.0000 mg | ORAL_TABLET | Freq: Two times a day (BID) | ORAL | Status: DC | PRN

## 2013-09-04 MED ORDER — IBUPROFEN 600 MG PO TABS: 600.0000 mg | ORAL_TABLET | Freq: Four times a day (QID) | ORAL | Status: DC | PRN
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
## 2013-09-04 NOTE — Addendum Note (Signed)
Addended by: Frederik Schmidt on: 09/04/2013 12:12 PM   Modules accepted: Level of Service

## 2013-09-06 NOTE — ED Provider Notes (Signed)
Evaluation and management procedures were performed by the PA/NP/CNM under my supervision/collaboration.   Shashana Fullington J Finnis Colee, MD 09/06/13 1140 

## 2013-09-17 ENCOUNTER — Encounter (HOSPITAL_COMMUNITY): Payer: Self-pay | Admitting: Emergency Medicine

## 2013-09-17 ENCOUNTER — Emergency Department (HOSPITAL_COMMUNITY)
Admission: EM | Admit: 2013-09-17 | Discharge: 2013-09-17 | Disposition: A | Discharge status: Home / Self Care | Payer: Medicaid Other | Attending: Emergency Medicine | Admitting: Emergency Medicine

## 2013-09-17 DIAGNOSIS — Z3202 Encounter for pregnancy test, result negative: Secondary | ICD-10-CM | POA: Insufficient documentation

## 2013-09-17 DIAGNOSIS — Z202 Contact with and (suspected) exposure to infections with a predominantly sexual mode of transmission: Secondary | ICD-10-CM

## 2013-09-17 DIAGNOSIS — N898 Other specified noninflammatory disorders of vagina: Secondary | ICD-10-CM | POA: Insufficient documentation

## 2013-09-17 DIAGNOSIS — Z79899 Other long term (current) drug therapy: Secondary | ICD-10-CM | POA: Insufficient documentation

## 2013-09-17 DIAGNOSIS — Z113 Encounter for screening for infections with a predominantly sexual mode of transmission: Secondary | ICD-10-CM | POA: Insufficient documentation

## 2013-09-17 DIAGNOSIS — Q690 Accessory finger(s): Secondary | ICD-10-CM | POA: Insufficient documentation

## 2013-09-17 DIAGNOSIS — Z8619 Personal history of other infectious and parasitic diseases: Secondary | ICD-10-CM | POA: Insufficient documentation

## 2013-09-17 LAB — URINALYSIS, ROUTINE W REFLEX MICROSCOPIC
Bilirubin Urine: NEGATIVE
Glucose, UA: NEGATIVE mg/dL
HGB URINE DIPSTICK: NEGATIVE
Ketones, ur: NEGATIVE mg/dL
LEUKOCYTES UA: NEGATIVE
Nitrite: NEGATIVE
PROTEIN: NEGATIVE mg/dL
Specific Gravity, Urine: 1.028 (ref 1.005–1.030)
Urobilinogen, UA: 0.2 mg/dL (ref 0.0–1.0)
pH: 5.5 (ref 5.0–8.0)

## 2013-09-17 LAB — WET PREP, GENITAL
Clue Cells Wet Prep HPF POC: NONE SEEN
Trich, Wet Prep: NONE SEEN
WBC WET PREP: NONE SEEN
Yeast Wet Prep HPF POC: NONE SEEN

## 2013-09-17 LAB — POC URINE PREG, ED: PREG TEST UR: NEGATIVE

## 2013-09-17 MED ORDER — LIDOCAINE HCL (PF) 1 % IJ SOLN
INTRAMUSCULAR | Status: AC
  Administered 2013-09-17: 1 mL
  Filled 2013-09-17: qty 5

## 2013-09-17 MED ORDER — AZITHROMYCIN 250 MG PO TABS
1000.0000 mg | ORAL_TABLET | Freq: Once | ORAL | Status: AC
  Administered 2013-09-17: 1000 mg via ORAL
  Filled 2013-09-17: qty 4

## 2013-09-17 MED ORDER — CEFTRIAXONE SODIUM 250 MG IJ SOLR
250.0000 mg | Freq: Once | INTRAMUSCULAR | Status: AC
  Administered 2013-09-17: 250 mg via INTRAMUSCULAR
  Filled 2013-09-17: qty 250

## 2013-09-17 NOTE — ED Provider Notes (Signed)
CSN: [id number]     Arrival date & time 09/17/13  [id number] History   First MD Initiated Contact with Patient 09/17/13 [phone]     Chief Complaint  Patient presents with  . Exposure to STD     (Consider location/radiation/quality/duration/timing/severity/associated sxs/prior Treatment) HPI Comments: Patient is a 23 year old female who presents with vaginal discharge. Symptoms started gradually and progressively worsened since the onset. Patient reports her sexual partner told her that she should get tested. Patient reports "feeling different down there." She reports being sexually active with her partner 1 week ago. No other associated symptoms. No aggravating/alleviating factors.   Patient is a [age] y.o. female presenting with STD exposure.  Exposure to STD Pertinent negatives include no abdominal pain, arthralgias, chest pain, chills, fatigue, fever, nausea, neck pain, vomiting or weakness.    Past Medical History  Diagnosis Date  . Chlamydia   . Polydactyly of fingers   . Abnormal Pap smear 2010  . Peanut allergy   . Rh negative status during pregnancy     Received Rhophylac at 29 weeks.   Past Surgical History  Procedure Laterality Date  . Wisdom tooth extraction    . Root canal    . Umbilical hernia repair N/A 08/22/2013    Procedure: HERNIA REPAIR UMBILICAL ;  Surgeon: Cherylynn RidgesJames O Wyatt, MD;  [facility];  Service: General;  Laterality: N/A;   History reviewed. No pertinent family history. History  Substance Use Topics  . Smoking status: Never Smoker   . Smokeless tobacco: Never Used  . Alcohol Use: Yes     Comment: 2- 3 times a month    OB History   Grav Para Term Preterm Abortions TAB SAB Ect Mult Living   1 1 1       1      Review of Systems  Constitutional: Negative for fever, chills and fatigue.  HENT: Negative for trouble swallowing.   Eyes: Negative for visual disturbance.  Respiratory: Negative for shortness of breath.   Cardiovascular: Negative for chest pain and  palpitations.  Gastrointestinal: Negative for nausea, vomiting, abdominal pain and diarrhea.  Genitourinary: Positive for vaginal discharge. Negative for dysuria and difficulty urinating.  Musculoskeletal: Negative for arthralgias and neck pain.  Skin: Negative for color change.  Neurological: Negative for dizziness and weakness.  Psychiatric/Behavioral: Negative for dysphoric mood.      Allergies  Peanuts  Home Medications   Prior to Admission medications   Medication Sig Start Date End Date Taking? Authorizing Provider  Bee Pollen 1000 MG TABS Take 1,000 mg by mouth 2 (two) times a week.     Historical Provider, MD  cyclobenzaprine (FLEXERIL) 10 MG tablet Take 1 tablet (10 mg total) by mouth 2 (two) times daily as needed for muscle spasms. 09/04/13   Trevor Maceobyn M Albert, PA-C  HYDROcodone-acetaminophen (NORCO) 5-325 MG per tablet 1-2 tablets every 4-6 hrs as needed for pain. 08/23/13   Valarie MerinoMatthew B Martin, MD  ibuprofen (ADVIL,MOTRIN) 600 MG tablet Take 1 tablet (600 mg total) by mouth every 6 (six) hours as needed. 09/04/13   Trevor Maceobyn M Albert, PA-C  Levonorgestrel-Ethinyl Estradiol (SEASONIQUE) 0.15-0.03 &0.01 MG tablet Take 1 tablet by mouth daily.    Historical Provider, MD   BP 104/66  Pulse 76  Temp(Src) 98.3 F (36.8 C) (Oral)  Resp 20  SpO2 95%  LMP 08/05/2013  Breastfeeding? No Physical Exam  Nursing note and vitals reviewed. Constitutional: She is oriented to person, place, and time. She appears well-developed  and well-nourished. No distress.  HENT:  Head: Normocephalic and atraumatic.  Eyes: Conjunctivae and EOM are normal.  Neck: Normal range of motion.  Cardiovascular: Normal rate and regular rhythm.  Exam reveals no gallop and no friction rub.   No murmur heard. Pulmonary/Chest: Effort normal and breath sounds normal. She has no wheezes. She has no rales. She exhibits no tenderness.  Abdominal: Soft. She exhibits no distension. There is no tenderness. There is no rebound and  no guarding.  Genitourinary:  Normal external genitalia. Copious thick, white vaginal discharge. Cervical os closed.   Musculoskeletal: Normal range of motion.  Neurological: She is alert and oriented to person, place, and time. Coordination normal.  Speech is goal-oriented. Moves limbs without ataxia.   Skin: Skin is warm and dry.  Psychiatric: She has a normal mood and affect. Her behavior is normal.    ED Course  Procedures (including critical care time) Labs Review Labs Reviewed  URINALYSIS, ROUTINE W REFLEX MICROSCOPIC - Abnormal; Notable for the following:    Color, Urine AMBER (*)    All other components within normal limits  WET PREP, GENITAL  GC/CHLAMYDIA PROBE AMP  POC URINE PREG, ED    Imaging Review No results found.   EKG Interpretation None      MDM   Final diagnoses:  Possible exposure to STD    9:44 AM Wet prep, GC/Chlamydia and urinalysis pending. Vitals stable and patient afebrile.   10:41 AM Urinalysis and wet prep unremarkable for acute changes. Patient will be treated for gonorrhea and chlamydia here. GC/chlamydia swab pending. Vitals stable and patient afebrile. No further evaluation needed at this time.     Emilia BeckKaitlyn Szekalski, PA-C 09/17/13 1044

## 2013-09-17 NOTE — Discharge Instructions (Signed)
You have been treated here for gonorrhea and chlamydia. [facility] if [facility] persist.

## 2013-09-17 NOTE — ED Provider Notes (Signed)
Medical screening examination/treatment/procedure(s) were conducted as a shared visit with non-physician practitioner(s) and myself.  I personally evaluated the patient during the encounter.  Pt presented with possible STD exposure.  ED evaluation unremarkable.  Will treat for possible std exposure.  Linwood DibblesJon Knapp, MD 09/17/13 1049

## 2013-09-17 NOTE — ED Notes (Signed)
Pt feels fine after receiving antibiotics. No reaction suspected.
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
## 2013-09-18 LAB — GC/CHLAMYDIA PROBE AMP
CT Probe RNA: NEGATIVE
GC Probe RNA: NEGATIVE

## 2013-09-26 ENCOUNTER — Telehealth (HOSPITAL_BASED_OUTPATIENT_CLINIC_OR_DEPARTMENT_OTHER): Payer: Self-pay | Admitting: *Deleted

## 2014-01-28 ENCOUNTER — Encounter (HOSPITAL_COMMUNITY): Payer: Self-pay | Admitting: Emergency Medicine

## 2014-06-26 ENCOUNTER — Encounter (HOSPITAL_COMMUNITY): Payer: Self-pay | Admitting: *Deleted

## 2014-06-27 ENCOUNTER — Encounter (HOSPITAL_COMMUNITY): Payer: Self-pay

## 2014-06-27 ENCOUNTER — Ambulatory Visit (HOSPITAL_COMMUNITY)
Admission: RE | Admit: 2014-06-27 | Discharge: 2014-06-27 | Disposition: A | Discharge status: Home / Self Care | Payer: Medicaid Other | Source: Ambulatory Visit | Attending: Obstetrics and Gynecology | Admitting: Obstetrics and Gynecology

## 2014-06-27 VITALS — BP 100/60 | Temp 97.9°F | Ht 61.0 in | Wt 92.8 lb

## 2014-06-27 DIAGNOSIS — R896 Abnormal cytological findings in specimens from other organs, systems and tissues: Secondary | ICD-10-CM | POA: Insufficient documentation

## 2014-06-27 DIAGNOSIS — Z1239 Encounter for other screening for malignant neoplasm of breast: Secondary | ICD-10-CM

## 2014-06-27 DIAGNOSIS — A630 Anogenital (venereal) warts: Secondary | ICD-10-CM | POA: Insufficient documentation

## 2014-06-27 DIAGNOSIS — R87810 Cervical high risk human papillomavirus (HPV) DNA test positive: Secondary | ICD-10-CM

## 2014-06-27 DIAGNOSIS — R87610 Atypical squamous cells of undetermined significance on cytologic smear of cervix (ASC-US): Secondary | ICD-10-CM

## 2014-06-27 NOTE — Patient Instructions (Signed)
Educational materials on self-breast awareness given. Explained to Linda Rivas the needed follow-up for her abnormal Pap smear. [facility] for colposcopy to follow-up for abnormal Pap smear. Appointment scheduled for Friday, July 26, 2014 at 1030. Patient aware of appointment and will be there. Screeening mammogram recommended at age 24 unless clinically indicated prior. Linda Rivas verbalized understanding.  Briana Farner, Kathaleen Maserhristine Poll, RN [phone] PM

## 2014-06-27 NOTE — Progress Notes (Signed)
[facility] due to having an abnormal Pap smear that a colposcopy is recommended for follow-up. Pap smear was completed 05/21/2014.  Pap Smear: Pap smear not completed today. [facility]  [facility] for colposcopy to follow-up for abnormal Pap smear. Appointment scheduled for Friday, July 26, 2014 at 1030. Per patient has a history of one previous Pap smear in addition to the the most recent Pap smear. Previous abnormal Pap smear was om 2010 and a repeat Pap smear was completed for follow-up.  Physical exam: Breasts Breasts symmetrical. No skin abnormalities bilateral breasts. No nipple retraction bilateral breasts. No nipple discharge bilateral breasts. No lymphadenopathy. No lumps palpated bilateral breasts. No complaints of pain or tenderness on exam. Screening mammogram recommended at age 24 unless clinically indicated prior.  Pelvic/Bimanual No Pap smear completed today since last Pap smear was 05/21/2014. Pap smear not indicated per [facility] guidelines.

## 2014-07-26 ENCOUNTER — Other Ambulatory Visit (HOSPITAL_COMMUNITY)
Admission: RE | Admit: 2014-07-26 | Discharge: 2014-07-26 | Disposition: A | Discharge status: Home / Self Care | Payer: Self-pay | Source: Ambulatory Visit | Attending: Obstetrics & Gynecology | Admitting: Obstetrics & Gynecology

## 2014-07-26 ENCOUNTER — Ambulatory Visit (INDEPENDENT_AMBULATORY_CARE_PROVIDER_SITE_OTHER): Payer: Self-pay | Admitting: Obstetrics & Gynecology

## 2014-07-26 ENCOUNTER — Encounter: Payer: Self-pay | Admitting: Obstetrics & Gynecology

## 2014-07-26 VITALS — BP 112/78 | HR 70 | Ht 61.0 in | Wt 91.0 lb

## 2014-07-26 DIAGNOSIS — R87810 Cervical high risk human papillomavirus (HPV) DNA test positive: Secondary | ICD-10-CM

## 2014-07-26 DIAGNOSIS — R87610 Atypical squamous cells of undetermined significance on cytologic smear of cervix (ASC-US): Secondary | ICD-10-CM

## 2014-07-26 LAB — POCT PREGNANCY, URINE: Preg Test, Ur: NEGATIVE

## 2014-07-26 NOTE — Progress Notes (Signed)
   Subjective:    Patient ID: Linda Rivas, female    DOB: Mar 04, 1991, 24 y.o.   MRN: [id number]  HPI  24 yo AA 23P1 (24 yo child) here for a colpo after a pap at [facility] showed ASCUS +HR HPV.  Review of Systems She rarely has sex and doesn't want contraception.    Objective:   Physical Exam  UPT negative, consent signed, time out done Cervix prepped with acetic acid. Transformation zone seen in its entirety. Colpo adequate. 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 She tolerated the procedure well.        Assessment & Plan:  ASCUS +HPV pap, colpo c/w CIN1 If ECC is negative, then repeat pap and cotesting in a year.

## 2014-08-22 ENCOUNTER — Telehealth: Payer: Self-pay

## 2014-08-22 NOTE — Telephone Encounter (Signed)
Colpo results reviewed--Per Dr. Marice Potterove patient should have repeat pap with co-testing in 1 year. Called patient and informed her of these results and recommendations. Patient verbalized understanding and gratitude. No questions or concerns.

## 2014-09-10 ENCOUNTER — Encounter: Payer: Self-pay | Admitting: General Practice

## 2014-10-29 ENCOUNTER — Encounter (HOSPITAL_COMMUNITY): Payer: Self-pay | Admitting: *Deleted

## 2014-10-29 ENCOUNTER — Emergency Department (HOSPITAL_COMMUNITY)
Admission: EM | Admit: 2014-10-29 | Discharge: 2014-10-29 | Discharge status: Against Medical Advice | Payer: Medicaid Other | Attending: Emergency Medicine | Admitting: Emergency Medicine

## 2014-10-29 DIAGNOSIS — R197 Diarrhea, unspecified: Secondary | ICD-10-CM | POA: Insufficient documentation

## 2014-10-29 DIAGNOSIS — M549 Dorsalgia, unspecified: Secondary | ICD-10-CM | POA: Diagnosis not present

## 2014-10-29 LAB — CBC
HCT: 36.4 % (ref 36.0–46.0)
HEMOGLOBIN: 12.2 g/dL (ref 12.0–15.0)
MCH: 30.6 pg (ref 26.0–34.0)
MCHC: 33.5 g/dL (ref 30.0–36.0)
MCV: 91.2 fL (ref 78.0–100.0)
PLATELETS: 257 10*3/uL (ref 150–400)
RBC: 3.99 MIL/uL (ref 3.87–5.11)
RDW: 12.7 % (ref 11.5–15.5)
WBC: 3.3 10*3/uL — ABNORMAL LOW (ref 4.0–10.5)

## 2014-10-29 LAB — URINALYSIS, ROUTINE W REFLEX MICROSCOPIC
Bilirubin Urine: NEGATIVE
GLUCOSE, UA: NEGATIVE mg/dL
HGB URINE DIPSTICK: NEGATIVE
Ketones, ur: NEGATIVE mg/dL
Leukocytes, UA: NEGATIVE
Nitrite: NEGATIVE
Protein, ur: NEGATIVE mg/dL
SPECIFIC GRAVITY, URINE: 1.024 (ref 1.005–1.030)
Urobilinogen, UA: 0.2 mg/dL (ref 0.0–1.0)
pH: 6 (ref 5.0–8.0)

## 2014-10-29 LAB — COMPREHENSIVE METABOLIC PANEL
ALBUMIN: 4 g/dL (ref 3.5–5.0)
ALK PHOS: 28 U/L — AB (ref 38–126)
ALT: 10 U/L — ABNORMAL LOW (ref 14–54)
ANION GAP: 5 (ref 5–15)
AST: 21 U/L (ref 15–41)
BILIRUBIN TOTAL: 1.1 mg/dL (ref 0.3–1.2)
BUN: 9 mg/dL (ref 6–20)
CALCIUM: 9.7 mg/dL (ref 8.9–10.3)
CO2: 29 mmol/L (ref 22–32)
Chloride: 102 mmol/L (ref 101–111)
Creatinine, Ser: 0.72 mg/dL (ref 0.44–1.00)
GFR calc Af Amer: >60 mL/min (ref >60)
GLUCOSE: 96 mg/dL (ref 65–99)
Potassium: 3.9 mmol/L (ref 3.5–5.1)
SODIUM: 136 mmol/L (ref 135–145)
Total Protein: 7.1 g/dL (ref 6.5–8.1)

## 2014-10-29 LAB — I-STAT BETA HCG BLOOD, ED (MC, WL, AP ONLY): I-stat hCG, quantitative: <5 m[IU]/mL (ref <5)

## 2014-10-29 LAB — LIPASE, BLOOD: LIPASE: 26 U/L (ref 22–51)

## 2014-10-29 NOTE — ED Notes (Addendum)
Pt reports left side back pain x 2 days with fatigue, nausea, diarrhea.no acute distress noted at triage. Denies urinary symptoms.

## 2015-03-11 ENCOUNTER — Encounter (HOSPITAL_COMMUNITY): Payer: Self-pay | Admitting: *Deleted

## 2015-03-11 ENCOUNTER — Emergency Department (HOSPITAL_COMMUNITY)
Admission: EM | Admit: 2015-03-11 | Discharge: 2015-03-11 | Discharge status: Against Medical Advice | Payer: Medicaid Other | Attending: Emergency Medicine | Admitting: Emergency Medicine

## 2015-03-11 DIAGNOSIS — Y998 Other external cause status: Secondary | ICD-10-CM | POA: Insufficient documentation

## 2015-03-11 DIAGNOSIS — S4992XA Unspecified injury of left shoulder and upper arm, initial encounter: Secondary | ICD-10-CM | POA: Insufficient documentation

## 2015-03-11 DIAGNOSIS — S199XXA Unspecified injury of neck, initial encounter: Secondary | ICD-10-CM | POA: Insufficient documentation

## 2015-03-11 DIAGNOSIS — Y92410 Unspecified street and highway as the place of occurrence of the external cause: Secondary | ICD-10-CM | POA: Insufficient documentation

## 2015-03-11 DIAGNOSIS — Y9389 Activity, other specified: Secondary | ICD-10-CM | POA: Diagnosis not present
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
## 2015-03-12 ENCOUNTER — Encounter (HOSPITAL_COMMUNITY): Payer: Self-pay | Admitting: Emergency Medicine

## 2015-03-12 ENCOUNTER — Emergency Department (HOSPITAL_COMMUNITY)
Admission: EM | Admit: 2015-03-12 | Discharge: 2015-03-12 | Disposition: A | Discharge status: Home / Self Care | Payer: Medicaid Other | Attending: Emergency Medicine | Admitting: Emergency Medicine

## 2015-03-12 DIAGNOSIS — Y9389 Activity, other specified: Secondary | ICD-10-CM | POA: Diagnosis not present

## 2015-03-12 DIAGNOSIS — Y998 Other external cause status: Secondary | ICD-10-CM | POA: Insufficient documentation

## 2015-03-12 DIAGNOSIS — S3992XA Unspecified injury of lower back, initial encounter: Secondary | ICD-10-CM | POA: Diagnosis not present

## 2015-03-12 DIAGNOSIS — Q690 Accessory finger(s): Secondary | ICD-10-CM | POA: Diagnosis not present

## 2015-03-12 DIAGNOSIS — S4992XA Unspecified injury of left shoulder and upper arm, initial encounter: Secondary | ICD-10-CM | POA: Diagnosis not present

## 2015-03-12 DIAGNOSIS — S4991XA Unspecified injury of right shoulder and upper arm, initial encounter: Secondary | ICD-10-CM | POA: Insufficient documentation

## 2015-03-12 DIAGNOSIS — S161XXA Strain of muscle, fascia and tendon at neck level, initial encounter: Secondary | ICD-10-CM | POA: Diagnosis not present

## 2015-03-12 DIAGNOSIS — Z8619 Personal history of other infectious and parasitic diseases: Secondary | ICD-10-CM | POA: Insufficient documentation

## 2015-03-12 DIAGNOSIS — S29001A Unspecified injury of muscle and tendon of front wall of thorax, initial encounter: Secondary | ICD-10-CM | POA: Insufficient documentation

## 2015-03-12 DIAGNOSIS — S0990XA Unspecified injury of head, initial encounter: Secondary | ICD-10-CM | POA: Diagnosis present

## 2015-03-12 DIAGNOSIS — S060X0A Concussion without loss of consciousness, initial encounter: Secondary | ICD-10-CM | POA: Diagnosis not present

## 2015-03-12 DIAGNOSIS — Y92410 Unspecified street and highway as the place of occurrence of the external cause: Secondary | ICD-10-CM | POA: Diagnosis not present

## 2015-03-12 MED ORDER — CYCLOBENZAPRINE HCL 10 MG PO TABS: 10.0000 mg | ORAL_TABLET | Freq: Three times a day (TID) | ORAL | Status: DC | PRN

## 2015-03-12 MED ORDER — NAPROXEN 500 MG PO TABS: 500.0000 mg | ORAL_TABLET | Freq: Two times a day (BID) | ORAL | Status: DC
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
## 2015-03-12 NOTE — ED Notes (Signed)
Declined W/C at D/C and was escorted to lobby by RN. 

## 2015-03-12 NOTE — ED Notes (Signed)
Pt called 2 x for triage. No answer.

## 2015-03-12 NOTE — ED Notes (Signed)
Pt called 3 x for triage. No answer 

## 2015-03-12 NOTE — ED Notes (Addendum)
Pt in PEDS with child waiting
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
## 2015-04-23 ENCOUNTER — Ambulatory Visit: Payer: Medicaid Other | Admitting: Obstetrics & Gynecology

## 2015-04-30 ENCOUNTER — Ambulatory Visit: Payer: Medicaid Other | Admitting: Obstetrics & Gynecology

## 2015-06-04 ENCOUNTER — Ambulatory Visit (INDEPENDENT_AMBULATORY_CARE_PROVIDER_SITE_OTHER): Payer: Medicaid Other | Admitting: Obstetrics & Gynecology

## 2015-06-04 ENCOUNTER — Encounter: Payer: Self-pay | Admitting: Obstetrics & Gynecology

## 2015-06-04 VITALS — BP 84/64 | HR 68 | Temp 97.6°F | Resp 20 | Ht 61.0 in | Wt 95.0 lb

## 2015-06-04 DIAGNOSIS — R102 Pelvic and perineal pain: Secondary | ICD-10-CM | POA: Diagnosis not present

## 2015-06-04 DIAGNOSIS — K5900 Constipation, unspecified: Secondary | ICD-10-CM | POA: Diagnosis not present

## 2015-06-04 DIAGNOSIS — R109 Unspecified abdominal pain: Secondary | ICD-10-CM

## 2015-06-04 NOTE — Progress Notes (Signed)
   Subjective:    Patient ID: Linda Rivas, female    DOB: 17-Feb-1991, 25 y.o.   MRN: [id number]  HPI  25 yo S AA P1 (27 yo son) is here wtth a 6 month h/o dyschezia when she is having her period as well as pain after sex. She has been monogamous for about a year. She reports negative cervical cultures at the health dept about 3 weeks ago.  Review of Systems She uses condoms sometimes and withdrawal at other times for contraception. She says that she does not want to be pregnant at this time.    Objective:   Physical Exam Thin AAF NAD Abd- benign Bimanual exam - NSS midplane, NT, mobile uterus, normal adnexal exam       Assessment & Plan:  Dyschezia with periods- possible endometriosis I have discussed the use of continuous OCPs for menstrual suppression. She reports being "crazy" with OCPs in the past. We discussed a possible diagnostic laparoscopy. Schedule gyn u/s RTC 2-4 weeks

## 2015-06-04 NOTE — Progress Notes (Signed)
Patient ID: Linda SparLinda L Rivas, female   DOB: 1990/08/14, 25 y.o.   MRN: [id number] Pt reports having some abdominal pain while having BM after intercourse and when she is having her period. Pt also states she may need Pap now due to previous abnormal. Colpo done 07/26/14.

## 2015-06-09 ENCOUNTER — Telehealth: Payer: Self-pay | Admitting: *Deleted

## 2015-06-09 NOTE — Telephone Encounter (Signed)
[facility] and left a message a [facility] is needed for pelvic ultrasound for 06/11/15.  Completed and note put on appt note.

## 2015-06-11 ENCOUNTER — Ambulatory Visit (HOSPITAL_COMMUNITY)
Admission: RE | Admit: 2015-06-11 | Discharge: 2015-06-11 | Disposition: A | Discharge status: Home / Self Care | Payer: Medicaid Other | Source: Ambulatory Visit | Attending: Obstetrics & Gynecology | Admitting: Obstetrics & Gynecology

## 2015-06-11 DIAGNOSIS — N838 Other noninflammatory disorders of ovary, fallopian tube and broad ligament: Secondary | ICD-10-CM | POA: Diagnosis not present

## 2015-06-11 DIAGNOSIS — R109 Unspecified abdominal pain: Secondary | ICD-10-CM | POA: Diagnosis not present

## 2015-07-02 ENCOUNTER — Ambulatory Visit: Payer: Medicaid Other | Admitting: Obstetrics and Gynecology

## 2015-10-06 ENCOUNTER — Telehealth (HOSPITAL_COMMUNITY): Payer: Self-pay | Admitting: *Deleted

## 2015-10-06 NOTE — Telephone Encounter (Signed)
Telephoned patient at home # and no answer. 

## 2015-10-13 ENCOUNTER — Telehealth (HOSPITAL_COMMUNITY): Payer: Self-pay | Admitting: *Deleted

## 2015-10-13 NOTE — Telephone Encounter (Signed)
Telephoned patient at home # and no answer. 

## 2015-11-03 ENCOUNTER — Encounter (HOSPITAL_COMMUNITY): Payer: Self-pay | Admitting: *Deleted

## 2015-11-03 NOTE — Progress Notes (Signed)
Letter sent to patient stating was time for pap smear. Unable to reach patient by phone. Tried to contact patient without success.

## 2016-01-08 LAB — GLUCOSE, POCT (MANUAL RESULT ENTRY): POC Glucose: 83 mg/dl (ref 70–99)

## 2016-02-05 NOTE — Congregational Nurse Program (Signed)
Congregational Nurse Program Note  Date of Encounter: 01/08/2016  Past Medical History: Past Medical History:  Diagnosis Date  . Abnormal Pap smear 2010  . Chlamydia   . Peanut allergy   . Polydactyly of fingers   . Rh negative status during pregnancy    Received Rhophylac at 29 weeks.    Encounter Details:     CNP Questionnaire - 01/08/16 1909      Patient Demographics   Is this a new or existing patient? New   Patient is considered a/an Not Applicable   Race African-American/Black     Patient Assistance   Location of Patient Assistance [facility]   Patient's financial/insurance status Medicaid   Uninsured Patient ([place] [employer]) No   Patient referred to apply for the following financial assistance Not Applicable   Food insecurities addressed Not Applicable   Transportation assistance No   Assistance securing medications No   Educational health offerings Navigating the healthcare system     Encounter Details   Primary purpose of visit Education/Health Concerns;Other  screening   Was an Emergency Department visit averted? No   Does patient have a medical provider? No   Patient referred to Establish PCP   Was a mental health screening completed? (GAINS tool) No   Does patient have dental issues? No   Does patient have vision issues? No   Does your patient have an abnormal blood pressure today? No   Since previous encounter, have you referred patient for abnormal blood pressure that resulted in a new diagnosis or medication change? No   Does your patient have an abnormal blood glucose today? No   Since previous encounter, have you referred patient for abnormal blood glucose that resulted in a new diagnosis or medication change? No   Was there a life-saving intervention made? No      Client needs list of dr. who take Medicaid.  Will research.   Client seen for screening of BP  and BS and flu vaccine

## 2016-02-10 ENCOUNTER — Encounter (HOSPITAL_COMMUNITY): Payer: Self-pay | Admitting: Family Medicine

## 2016-02-10 ENCOUNTER — Ambulatory Visit (HOSPITAL_COMMUNITY)
Admission: EM | Admit: 2016-02-10 | Discharge: 2016-02-10 | Disposition: A | Discharge status: Home / Self Care | Payer: Medicaid Other | Attending: Family Medicine | Admitting: Family Medicine

## 2016-02-10 DIAGNOSIS — N898 Other specified noninflammatory disorders of vagina: Secondary | ICD-10-CM

## 2016-02-10 DIAGNOSIS — Z202 Contact with and (suspected) exposure to infections with a predominantly sexual mode of transmission: Secondary | ICD-10-CM

## 2016-02-10 LAB — POCT URINALYSIS DIP (DEVICE)
Bilirubin Urine: NEGATIVE
Glucose, UA: NEGATIVE mg/dL
Hgb urine dipstick: NEGATIVE
KETONES UR: NEGATIVE mg/dL
Leukocytes, UA: NEGATIVE
Nitrite: NEGATIVE
PH: 6.5 (ref 5.0–8.0)
Protein, ur: NEGATIVE mg/dL
SPECIFIC GRAVITY, URINE: 1.02 (ref 1.005–1.030)
Urobilinogen, UA: 0.2 mg/dL (ref 0.0–1.0)

## 2016-02-10 LAB — POCT PREGNANCY, URINE: Preg Test, Ur: NEGATIVE

## 2016-02-10 MED ORDER — METRONIDAZOLE 500 MG PO TABS: 500.0000 mg | ORAL_TABLET | Freq: Two times a day (BID) | ORAL | 0 refills | Status: DC

## 2016-02-10 NOTE — ED Triage Notes (Signed)
Pt here for lower back pain and foul discharge. sts she has been exposed to STI a few weeks ago.

## 2016-02-10 NOTE — Discharge Instructions (Signed)
Use medicine as prescribed, we will call if tests show a need for other treatment.  

## 2016-02-10 NOTE — ED Provider Notes (Signed)
[facility]    CSN: [id number] Arrival date & time: 02/10/16  1130     History   Chief Complaint Chief Complaint  Patient presents with  . Exposure to STD    HPI Linda Rivas is a 25 y.o. female.   The history is provided by the patient.  Exposure to STD  This is a recurrent problem. The current episode started more than 1 week ago (2 wks of sx.). The problem has been gradually worsening (unprotected sex 2 wks ago, developing sx since.). Pertinent negatives include no chest pain and no abdominal pain. Nothing aggravates the symptoms. Nothing relieves the symptoms. She has tried nothing for the symptoms.    Past Medical History:  Diagnosis Date  . Abnormal Pap smear 2010  . Chlamydia   . Peanut allergy   . Polydactyly of fingers   . Rh negative status during pregnancy    Received Rhophylac at 29 weeks.    Patient Active Problem List   Diagnosis Date Noted  . Postop check 09/04/2013  . Umbilical hernia 08/14/2013  . Vaginal delivery 12/06/2012  . IUGR (intrauterine growth restriction) 12/05/2012  . 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 Rh negative status during pregnancy     Past Surgical History:  Procedure Laterality Date  . ROOT CANAL    . UMBILICAL HERNIA REPAIR N/A 08/22/2013   Procedure: HERNIA REPAIR UMBILICAL ;  Surgeon: Cherylynn RidgesJames O Wyatt, MD;  [facility];  Service: General;  Laterality: N/A;  . WISDOM TOOTH EXTRACTION      OB History    Gravida Para Term Preterm AB Living   1 1 1     1    SAB TAB Ectopic Multiple Live Births           1       Home Medications    Prior to Admission medications   Not on File    Family History Family History  Problem Relation Age of Onset  . Hypertension Mother   . Hypertension Father     Social History Social History  Substance Use Topics  . Smoking status: Never Smoker  . Smokeless tobacco: Never Used  . Alcohol use Yes     Comment: 2- 3 times  a month      Allergies   Peanuts [peanut oil]   Review of Systems Review of Systems  Constitutional: Negative.   Cardiovascular: Negative for chest pain.  Gastrointestinal: Negative.  Negative for abdominal pain.  Genitourinary: Positive for vaginal discharge. Negative for dysuria, menstrual problem and pelvic pain.  All other systems reviewed and are negative.    Physical Exam Triage Vital Signs ED Triage Vitals [02/10/16 1146]  Enc Vitals Group     BP 106/71     Pulse Rate 71     Resp 18     Temp 98.1 F (36.7 C)     Temp src      SpO2 99 %     Weight      Height      Head Circumference      Peak Flow      Pain Score      Pain Loc      Pain Edu?      Excl. in GC?    No data found.   Updated Vital Signs BP 106/71   Pulse 71   Temp 98.1 F (36.7 C)   Resp 18   LMP 01/25/2016  SpO2 99%   Visual Acuity Right Eye Distance:   Left Eye Distance:   Bilateral Distance:    Right Eye Near:   Left Eye Near:    Bilateral Near:     Physical Exam  Constitutional: She appears well-developed and well-nourished. No distress.  Pulmonary/Chest: Effort normal and breath sounds normal.  Abdominal: Soft. Bowel sounds are normal. There is no tenderness. There is no guarding.  Skin: Skin is warm and dry.  Nursing note and vitals reviewed.    UC Treatments / Results  Labs (all labs ordered are listed, but only abnormal results are displayed) Labs Reviewed - No data to display upreg neg  EKG  EKG Interpretation None       Radiology No results found.  Procedures Procedures (including critical care time)  Medications Ordered in UC Medications - No data to display   Initial Impression / Assessment and Plan / UC Course  I have reviewed the triage vital signs and the nursing notes.  Pertinent labs & imaging results that were available during my care of the patient were reviewed by me and considered in my medical decision making (see chart for  details).  Clinical Course       Final Clinical Impressions(s) / UC Diagnoses   Final diagnoses:  None    New Prescriptions New Prescriptions   No medications on file     Linna HoffJames D Dainelle Hun, MD 02/11/16 1348

## 2016-02-12 LAB — URINE CYTOLOGY ANCILLARY ONLY
Chlamydia: NEGATIVE
NEISSERIA GONORRHEA: NEGATIVE
Trichomonas: NEGATIVE

## 2016-02-13 LAB — URINE CYTOLOGY ANCILLARY ONLY

## 2016-02-16 ENCOUNTER — Telehealth (HOSPITAL_COMMUNITY): Payer: Self-pay | Admitting: Emergency Medicine

## 2016-02-16 NOTE — Telephone Encounter (Signed)
-----   Message from Eustace MooreLaura W Murray, MD sent at 02/13/2016  3:17 PM EST ----- Please let patient know that test for bacterial vaginosis was positive.  Prescription for metronidazole was given at the urgent care visit 02/10/16.  Recheck for further evaluation if symptoms persist.  LM

## 2016-02-16 NOTE — Telephone Encounter (Signed)
Called pt and notified of recent lab results Pt ID'd properly... Reports feeling better and sx have subsided Also states she has not p/u Rx yet but will go later on tomorrow to p/u and start medication.  Adv pt if sx are not getting better to return or to f/u w/PCP Education on safe sex given Pt verb understanding.

## 2017-08-27 ENCOUNTER — Encounter

## 2017-10-11 DIAGNOSIS — F4320 Adjustment disorder, unspecified: Secondary | ICD-10-CM | POA: Diagnosis not present

## 2017-11-03 DIAGNOSIS — Z0389 Encounter for observation for other suspected diseases and conditions ruled out: Secondary | ICD-10-CM | POA: Diagnosis not present

## 2017-11-03 DIAGNOSIS — Z3009 Encounter for other general counseling and advice on contraception: Secondary | ICD-10-CM | POA: Diagnosis not present

## 2017-11-03 DIAGNOSIS — Z113 Encounter for screening for infections with a predominantly sexual mode of transmission: Secondary | ICD-10-CM | POA: Diagnosis not present

## 2017-11-03 DIAGNOSIS — Z1388 Encounter for screening for disorder due to exposure to contaminants: Secondary | ICD-10-CM | POA: Diagnosis not present

## 2017-11-07 DIAGNOSIS — F4320 Adjustment disorder, unspecified: Secondary | ICD-10-CM | POA: Diagnosis not present

## 2017-11-14 DIAGNOSIS — F4320 Adjustment disorder, unspecified: Secondary | ICD-10-CM | POA: Diagnosis not present

## 2017-11-24 DIAGNOSIS — F4320 Adjustment disorder, unspecified: Secondary | ICD-10-CM | POA: Diagnosis not present

## 2017-12-08 DIAGNOSIS — F4320 Adjustment disorder, unspecified: Secondary | ICD-10-CM | POA: Diagnosis not present

## 2017-12-13 DIAGNOSIS — F4320 Adjustment disorder, unspecified: Secondary | ICD-10-CM | POA: Diagnosis not present

## 2017-12-20 DIAGNOSIS — F4320 Adjustment disorder, unspecified: Secondary | ICD-10-CM | POA: Diagnosis not present

## 2017-12-27 DIAGNOSIS — F4320 Adjustment disorder, unspecified: Secondary | ICD-10-CM | POA: Diagnosis not present

## 2018-01-03 DIAGNOSIS — F4320 Adjustment disorder, unspecified: Secondary | ICD-10-CM | POA: Diagnosis not present

## 2018-01-11 DIAGNOSIS — F4320 Adjustment disorder, unspecified: Secondary | ICD-10-CM | POA: Diagnosis not present

## 2018-01-17 DIAGNOSIS — F4320 Adjustment disorder, unspecified: Secondary | ICD-10-CM | POA: Diagnosis not present

## 2018-01-24 DIAGNOSIS — F4320 Adjustment disorder, unspecified: Secondary | ICD-10-CM | POA: Diagnosis not present

## 2018-01-31 DIAGNOSIS — F4320 Adjustment disorder, unspecified: Secondary | ICD-10-CM | POA: Diagnosis not present

## 2018-02-07 DIAGNOSIS — F4320 Adjustment disorder, unspecified: Secondary | ICD-10-CM | POA: Diagnosis not present

## 2018-02-14 DIAGNOSIS — F4320 Adjustment disorder, unspecified: Secondary | ICD-10-CM | POA: Diagnosis not present

## 2018-02-21 DIAGNOSIS — F4320 Adjustment disorder, unspecified: Secondary | ICD-10-CM | POA: Diagnosis not present

## 2018-03-02 DIAGNOSIS — F4320 Adjustment disorder, unspecified: Secondary | ICD-10-CM | POA: Diagnosis not present

## 2018-03-09 DIAGNOSIS — F4320 Adjustment disorder, unspecified: Secondary | ICD-10-CM | POA: Diagnosis not present

## 2018-04-20 DIAGNOSIS — F4320 Adjustment disorder, unspecified: Secondary | ICD-10-CM | POA: Diagnosis not present

## 2018-04-27 DIAGNOSIS — F4320 Adjustment disorder, unspecified: Secondary | ICD-10-CM | POA: Diagnosis not present

## 2018-05-03 DIAGNOSIS — F4320 Adjustment disorder, unspecified: Secondary | ICD-10-CM | POA: Diagnosis not present

## 2018-05-11 DIAGNOSIS — F4320 Adjustment disorder, unspecified: Secondary | ICD-10-CM | POA: Diagnosis not present

## 2018-05-25 DIAGNOSIS — F4320 Adjustment disorder, unspecified: Secondary | ICD-10-CM | POA: Diagnosis not present

## 2018-07-25 DIAGNOSIS — Z113 Encounter for screening for infections with a predominantly sexual mode of transmission: Secondary | ICD-10-CM | POA: Diagnosis not present

## 2018-07-25 DIAGNOSIS — Z114 Encounter for screening for human immunodeficiency virus [HIV]: Secondary | ICD-10-CM | POA: Diagnosis not present

## 2018-07-25 DIAGNOSIS — Z3202 Encounter for pregnancy test, result negative: Secondary | ICD-10-CM | POA: Diagnosis not present

## 2018-08-12 DIAGNOSIS — Z113 Encounter for screening for infections with a predominantly sexual mode of transmission: Secondary | ICD-10-CM | POA: Diagnosis not present

## 2018-08-12 DIAGNOSIS — Z114 Encounter for screening for human immunodeficiency virus [HIV]: Secondary | ICD-10-CM | POA: Diagnosis not present

## 2018-09-13 DIAGNOSIS — Z114 Encounter for screening for human immunodeficiency virus [HIV]: Secondary | ICD-10-CM | POA: Diagnosis not present

## 2018-09-13 DIAGNOSIS — Z3009 Encounter for other general counseling and advice on contraception: Secondary | ICD-10-CM | POA: Diagnosis not present

## 2018-09-13 DIAGNOSIS — N760 Acute vaginitis: Secondary | ICD-10-CM | POA: Diagnosis not present

## 2018-09-13 DIAGNOSIS — Z0389 Encounter for observation for other suspected diseases and conditions ruled out: Secondary | ICD-10-CM | POA: Diagnosis not present

## 2018-09-13 DIAGNOSIS — Z113 Encounter for screening for infections with a predominantly sexual mode of transmission: Secondary | ICD-10-CM | POA: Diagnosis not present

## 2018-09-13 DIAGNOSIS — Z1388 Encounter for screening for disorder due to exposure to contaminants: Secondary | ICD-10-CM | POA: Diagnosis not present

## 2018-11-06 DIAGNOSIS — Z113 Encounter for screening for infections with a predominantly sexual mode of transmission: Secondary | ICD-10-CM | POA: Diagnosis not present

## 2018-11-06 DIAGNOSIS — N760 Acute vaginitis: Secondary | ICD-10-CM | POA: Diagnosis not present

## 2019-01-26 DIAGNOSIS — Z114 Encounter for screening for human immunodeficiency virus [HIV]: Secondary | ICD-10-CM | POA: Diagnosis not present

## 2019-01-26 DIAGNOSIS — Z113 Encounter for screening for infections with a predominantly sexual mode of transmission: Secondary | ICD-10-CM | POA: Diagnosis not present

## 2019-04-10 DIAGNOSIS — Z5181 Encounter for therapeutic drug level monitoring: Secondary | ICD-10-CM | POA: Diagnosis not present

## 2019-05-24 ENCOUNTER — Ambulatory Visit: Payer: Medicaid Other | Attending: Internal Medicine

## 2019-05-24 DIAGNOSIS — Z20822 Contact with and (suspected) exposure to covid-19: Secondary | ICD-10-CM

## 2019-05-25 LAB — NOVEL CORONAVIRUS, NAA: SARS-CoV-2, NAA: DETECTED — AB

## 2019-06-04 ENCOUNTER — Other Ambulatory Visit: Payer: Medicaid Other

## 2019-06-04 ENCOUNTER — Ambulatory Visit: Payer: Medicaid Other | Attending: Internal Medicine

## 2019-06-04 DIAGNOSIS — Z20822 Contact with and (suspected) exposure to covid-19: Secondary | ICD-10-CM

## 2019-06-05 LAB — NOVEL CORONAVIRUS, NAA: SARS-CoV-2, NAA: NOT DETECTED

## 2021-07-21 ENCOUNTER — Ambulatory Visit: Payer: Medicaid Other | Admitting: Obstetrics and Gynecology

## 2021-08-12 ENCOUNTER — Ambulatory Visit: Payer: Medicaid Other | Admitting: Obstetrics & Gynecology

## 2021-09-09 ENCOUNTER — Ambulatory Visit (INDEPENDENT_AMBULATORY_CARE_PROVIDER_SITE_OTHER): Payer: Medicaid Other | Admitting: Student

## 2021-09-09 ENCOUNTER — Other Ambulatory Visit (HOSPITAL_COMMUNITY)
Admission: RE | Admit: 2021-09-09 | Discharge: 2021-09-09 | Disposition: A | Discharge status: Home / Self Care | Payer: Medicaid Other | Source: Ambulatory Visit | Attending: Obstetrics and Gynecology | Admitting: Obstetrics and Gynecology

## 2021-09-09 ENCOUNTER — Encounter: Payer: Self-pay | Admitting: Student

## 2021-09-09 VITALS — BP 122/79 | HR 82 | Ht 61.0 in | Wt 104.2 lb

## 2021-09-09 DIAGNOSIS — Z01419 Encounter for gynecological examination (general) (routine) without abnormal findings: Secondary | ICD-10-CM | POA: Insufficient documentation

## 2021-09-09 DIAGNOSIS — Z113 Encounter for screening for infections with a predominantly sexual mode of transmission: Secondary | ICD-10-CM

## 2021-09-09 NOTE — Progress Notes (Signed)
NGYN pt presents to establish care. Pt requesting STD testing today.

## 2021-09-09 NOTE — Progress Notes (Signed)
ANNUAL EXAM Patient name: Linda Rivas MRN [id number]  Date of birth: Jul 27, 1990 Chief Complaint:   Routine Prenatal Visit  History of Present Illness:   Linda Rivas is a 31 y.o. G39P1001 African-American female being seen today for a routine annual exam. Patient is doing well and studying for her LAST. Current complaints: No complaints  Patient's last menstrual period was 09/07/2021.   The pregnancy intention screening data noted above was reviewed. Potential methods of contraception were discussed. The patient elected to proceed with NONE at this time.   Last pap Unknown. Results were:  normal per patient . H/O abnormal pap: no Last mammogram: N/A. Results were: N/A. Family h/o breast cancer: no Last colonoscopy: N/A. Results were: N/A. Family h/o colorectal cancer: no     09/09/2021    2:47 PM  Depression screen PHQ 2/9  Decreased Interest 0  Down, Depressed, Hopeless 0  PHQ - 2 Score 0  Altered sleeping 0  Tired, decreased energy 0  Change in appetite 0  Feeling bad or failure about yourself  0  Trouble concentrating 0  Moving slowly or fidgety/restless 0  Suicidal thoughts 0  PHQ-9 Score 0        09/09/2021    2:48 PM  GAD 7 : Generalized Anxiety Score  Nervous, Anxious, on Edge 0  Control/stop worrying 0  Worry too much - different things 0  Trouble relaxing 0  Restless 0  Easily annoyed or irritable 0  Afraid - awful might happen 0  Total GAD 7 Score 0     Review of Systems:   Pertinent items are noted in HPI Denies any headaches, blurred vision, fatigue, shortness of breath, chest pain, abdominal pain, abnormal vaginal discharge/itching/odor/irritation, problems with periods, bowel movements, urination, or intercourse unless otherwise stated above. Pertinent History Reviewed:  Reviewed past medical,surgical, social and family history.  Reviewed problem list, medications and allergies. Physical Assessment:   Vitals:   09/09/21 1431  BP: 122/79   Pulse: 82  Weight: 104 lb 3.2 oz (47.3 kg)  Height: 5\' 1"  (1.549 m)  Body mass index is 19.69 kg/m.        Physical Examination:   General appearance - well appearing, and in no distress  Mental status - alert, oriented to person, place, and time  Psych:  She has a normal mood and affect  Skin - warm and dry, normal color, no suspicious lesions noted  Chest - effort normal, clear  bilaterally  Heart - normal rate and regular rhythm  Neck:  midline trachea, no thyromegaly or nodules  Breasts - breasts appear normal, no suspicious masses, no skin or nipple changes or  axillary nodes  Abdomen - soft, nontender, nondistended, no masses or organomegaly  Pelvic - VULVA: normal appearing vulva with no masses, tenderness or lesions  VAGINA: normal appearing vagina with normal color and discharge, no lesions CERVIX: normal appearing cervix without discharge or lesions, no CMT  Thin prep pap is done  HR HPV cotesting if reflux positive  UTERUS: uterus is felt to be normal size, shape, consistency and nontender   ADNEXA: No adnexal masses or tenderness noted.    Extremities:  No swelling or varicosities noted  Chaperone present for exam  No results found for this or any previous visit (from the past 24 hour(s)).  Assessment & Plan:  11. Women's annual routine gynecological examination - Normal exam - Brief contraception counseling provided. Patient deferred initiation of contraceptive method at this time. Does  not desire pregnancy at this time. - Cervicovaginal ancillary only( [place]) - Cytology - PAP( [place])  2. Routine screening for STI (sexually transmitted infection) - One consistent female partner - Cervicovaginal ancillary only( [place]) - HIV antibody (with reflex) - Hepatitis C Antibody - Hepatitis B Surface AntiGEN - RPR  Labs/procedures today: noted above  Mammogram: , or sooner if problems Colonoscopy: @ 31yo, or sooner if problems  Orders Placed This  Encounter  Procedures   HIV antibody (with reflex)   Hepatitis C Antibody   Hepatitis B Surface AntiGEN   RPR    Meds: No orders of the defined types were placed in this encounter.   Follow-up: Return in about 1 year (around 09/10/2022) for IN-PERSON, ANN.  Corlis Hove, NP 09/09/2021 4:10 PM

## 2021-09-10 LAB — CERVICOVAGINAL ANCILLARY ONLY
Bacterial Vaginitis (gardnerella): POSITIVE — AB
Candida Glabrata: NEGATIVE
Candida Vaginitis: NEGATIVE
Chlamydia: NEGATIVE
Comment: NEGATIVE
Comment: NEGATIVE
Comment: NEGATIVE
Comment: NEGATIVE
Comment: NEGATIVE
Comment: NORMAL
Neisseria Gonorrhea: NEGATIVE
Trichomonas: NEGATIVE

## 2021-09-10 LAB — HIV ANTIBODY (ROUTINE TESTING W REFLEX): HIV Screen 4th Generation wRfx: NONREACTIVE

## 2021-09-10 LAB — HEPATITIS C ANTIBODY: Hep C Virus Ab: NONREACTIVE

## 2021-09-10 LAB — HEPATITIS B SURFACE ANTIGEN: Hepatitis B Surface Ag: NEGATIVE

## 2021-09-10 LAB — RPR: RPR Ser Ql: NONREACTIVE

## 2021-09-13 LAB — CYTOLOGY - PAP
Comment: NEGATIVE
Diagnosis: UNDETERMINED — AB
High risk HPV: NEGATIVE

## 2021-09-16 ENCOUNTER — Ambulatory Visit: Payer: Medicaid Other | Admitting: Obstetrics

## 2021-09-22 ENCOUNTER — Other Ambulatory Visit: Payer: Self-pay | Admitting: Student

## 2021-09-22 DIAGNOSIS — B9689 Other specified bacterial agents as the cause of diseases classified elsewhere: Secondary | ICD-10-CM

## 2021-09-22 MED ORDER — METRONIDAZOLE 500 MG PO TABS: 500.0000 mg | ORAL_TABLET | Freq: Two times a day (BID) | ORAL | 0 refills | Status: DC

## 2021-11-04 ENCOUNTER — Ambulatory Visit
Admission: EM | Admit: 2021-11-04 | Discharge: 2021-11-04 | Disposition: A | Discharge status: Home / Self Care | Payer: Medicaid Other | Attending: Internal Medicine | Admitting: Internal Medicine

## 2021-11-04 DIAGNOSIS — N898 Other specified noninflammatory disorders of vagina: Secondary | ICD-10-CM | POA: Diagnosis present

## 2021-11-04 DIAGNOSIS — Z3202 Encounter for pregnancy test, result negative: Secondary | ICD-10-CM | POA: Diagnosis present

## 2021-11-04 DIAGNOSIS — Z113 Encounter for screening for infections with a predominantly sexual mode of transmission: Secondary | ICD-10-CM | POA: Insufficient documentation

## 2021-11-04 LAB — POCT URINE PREGNANCY: Preg Test, Ur: NEGATIVE

## 2021-11-04 NOTE — ED Provider Notes (Signed)
[facility]    CSN: [id number] Arrival date & time: 11/04/21  2355      History   Chief Complaint Chief Complaint  Patient presents with   vaginal irritation    HPI Linda Rivas is a 31 y.o. female.   Patient presents with vaginal discharge that started about 2 days ago.  Patient reports that it is white in color.  Denies any associated urinary symptoms, abdominal pain, pelvic pain, back pain, fever, hematuria, abnormal vaginal bleeding.  Last menstrual cycle was approximately 1 month ago.  Denies any obvious exposure to STD but would like STD testing today including HIV and syphilis testing.     Past Medical History:  Diagnosis Date   Abnormal Pap smear 2010   Chlamydia    Peanut allergy    Polydactyly of fingers    Rh negative status during pregnancy    Received Rhophylac at 29 weeks.    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 Authorizing Provider  metroNIDAZOLE (FLAGYL) 500 MG tablet Take 1 tablet (500 mg total) by mouth 2 (two) times daily. 09/22/21   Corlis Hove, NP    Family History Family History  Problem Relation Age of Onset    Hypertension Mother    Lupus Mother    Hypertension Father    Prostate cancer Father    Heart attack Father     Social History Social History   Tobacco Use   Smoking status: Never   Smokeless tobacco: Never  Substance Use Topics   Alcohol use: Yes    Alcohol/week: 1.0 standard drink of alcohol    Types: 1 Glasses of wine per week    Comment: daily   Drug use: No    Types: Marijuana    Comment: past use     Allergies   Peanuts [peanut oil]   Review of Systems Review of Systems Per HPI  Physical Exam Triage Vital Signs ED Triage Vitals  Enc Vitals Group     BP 11/04/21 0923 120/85     Pulse Rate 11/04/21 0923 80     Resp 11/04/21 0923 18     Temp 11/04/21 0923 98 F (36.7 C)     Temp Source 11/04/21 0923 Oral     SpO2 11/04/21 0923 98 %     Weight --  Height --      Head Circumference --      Peak Flow --      Pain Score 11/04/21 0924 0     Pain Loc --      Pain Edu? --      Excl. in GC? --    No data found.  Updated Vital Signs BP 120/85 (BP Location: Left Arm)   Pulse 80   Temp 98 F (36.7 C) (Oral)   Resp 18   SpO2 98%   Visual Acuity Right Eye Distance:   Left Eye Distance:   Bilateral Distance:    Right Eye Near:   Left Eye Near:    Bilateral Near:     Physical Exam Constitutional:      General: She is not in acute distress.    Appearance: Normal appearance. She is not toxic-appearing or diaphoretic.  HENT:     Head: Normocephalic and atraumatic.  Eyes:     Extraocular Movements: Extraocular movements intact.     Conjunctiva/sclera: Conjunctivae normal.  Cardiovascular:     Rate and Rhythm: Normal rate and regular rhythm.     Pulses: Normal pulses.     Heart sounds: Normal heart sounds.  Pulmonary:     Effort: Pulmonary effort is normal. No respiratory distress.     Breath sounds: Normal breath sounds.  Abdominal:     General: Bowel sounds are normal. There is no distension.     Palpations: Abdomen is soft.      Tenderness: There is no abdominal tenderness.  Genitourinary:    Comments: Deferred with shared decision-making. Self swab performed. Neurological:     General: No focal deficit present.     Mental Status: She is alert and oriented to person, place, and time. Mental status is at baseline.  Psychiatric:        Mood and Affect: Mood normal.        Behavior: Behavior normal.        Thought Content: Thought content normal.        Judgment: Judgment normal.      UC Treatments / Results  Labs (all labs ordered are listed, but only abnormal results are displayed) Labs Reviewed  RPR  HIV ANTIBODY (ROUTINE TESTING W REFLEX)  POCT URINE PREGNANCY  CERVICOVAGINAL ANCILLARY ONLY    EKG   Radiology No results found.  Procedures Procedures (including critical care time)  Medications Ordered in UC Medications - No data to display  Initial Impression / Assessment and Plan / UC Course  I have reviewed the triage vital signs and the nursing notes.  Pertinent labs & imaging results that were available during my care of the patient were reviewed by me and considered in my medical decision making (see chart for details).     Cervicovaginal swab, HIV, RPR pending.  Will await results for any further treatment given no obvious exposure to STD.  UA was deferred given no urinary symptoms.  Urine pregnancy was negative.  Advised patient to refrain from sexual activity until test results and treatment are complete.  Discussed return precautions.  Patient verbalized understanding and was agreeable with plan. Final Clinical Impressions(s) / UC Diagnoses   Final diagnoses:  Vaginal discharge  Screening examination for venereal disease  Urine pregnancy test negative     Discharge Instructions      Your vaginal swab and blood work are pending.  Will call if there are abnormal and treat as appropriate.  Please refrain from sexual  activity until test results and treatment are  complete.    ED Prescriptions   None    PDMP not reviewed this encounter.   Gustavus Bryant, [place] 11/04/21 [phone]

## 2021-11-04 NOTE — Discharge Instructions (Signed)
Your vaginal swab and blood work are pending.  Will call if there are abnormal and treat as appropriate.  Please refrain from sexual activity until test results and treatment are complete.

## 2021-11-04 NOTE — ED Triage Notes (Signed)
Pt c/o vaginal discharge, vaginal discomfort, x2 days.

## 2021-11-05 LAB — CERVICOVAGINAL ANCILLARY ONLY
Bacterial Vaginitis (gardnerella): POSITIVE — AB
Candida Glabrata: NEGATIVE
Candida Vaginitis: POSITIVE — AB
Chlamydia: NEGATIVE
Comment: NEGATIVE
Comment: NEGATIVE
Comment: NEGATIVE
Comment: NEGATIVE
Comment: NEGATIVE
Comment: NORMAL
Neisseria Gonorrhea: NEGATIVE
Trichomonas: NEGATIVE

## 2021-11-05 LAB — HIV ANTIBODY (ROUTINE TESTING W REFLEX): HIV Screen 4th Generation wRfx: NONREACTIVE

## 2021-11-05 LAB — RPR: RPR Ser Ql: NONREACTIVE

## 2021-11-06 ENCOUNTER — Telehealth: Payer: Self-pay | Admitting: Internal Medicine

## 2021-11-06 MED ORDER — METRONIDAZOLE 500 MG PO TABS: 500.0000 mg | ORAL_TABLET | Freq: Two times a day (BID) | ORAL | 0 refills | Status: DC

## 2021-11-06 MED ORDER — FLUCONAZOLE 150 MG PO TABS: 150.0000 mg | ORAL_TABLET | ORAL | 0 refills | Status: DC

## 2021-11-06 NOTE — Telephone Encounter (Signed)
Patient called and notified of positive results on cervical vaginal swab for bacterial vaginosis and vaginal yeast.  Metronidazole and Diflucan sent for patient.  Education provided.  All questions answered.

## 2021-11-11 ENCOUNTER — Ambulatory Visit: Payer: Medicaid Other | Admitting: Student

## 2022-07-11 ENCOUNTER — Emergency Department (HOSPITAL_COMMUNITY): Payer: Medicaid Other

## 2022-07-11 ENCOUNTER — Other Ambulatory Visit: Payer: Self-pay

## 2022-07-11 ENCOUNTER — Emergency Department (HOSPITAL_COMMUNITY)
Admission: EM | Admit: 2022-07-11 | Discharge: 2022-07-12 | Disposition: A | Discharge status: Home / Self Care | Payer: Medicaid Other | Attending: Emergency Medicine | Admitting: Emergency Medicine

## 2022-07-11 ENCOUNTER — Encounter (HOSPITAL_COMMUNITY): Payer: Self-pay | Admitting: Emergency Medicine

## 2022-07-11 DIAGNOSIS — Z5321 Procedure and treatment not carried out due to patient leaving prior to being seen by health care provider: Secondary | ICD-10-CM | POA: Diagnosis not present

## 2022-07-11 DIAGNOSIS — R0602 Shortness of breath: Secondary | ICD-10-CM | POA: Insufficient documentation

## 2022-07-11 DIAGNOSIS — R531 Weakness: Secondary | ICD-10-CM | POA: Insufficient documentation

## 2022-07-11 DIAGNOSIS — R079 Chest pain, unspecified: Secondary | ICD-10-CM | POA: Diagnosis present

## 2022-07-11 DIAGNOSIS — R059 Cough, unspecified: Secondary | ICD-10-CM | POA: Insufficient documentation

## 2022-07-11 LAB — BASIC METABOLIC PANEL
Anion gap: 15 (ref 5–15)
BUN: 5 mg/dL — ABNORMAL LOW (ref 6–20)
CO2: 22 mmol/L (ref 22–32)
Calcium: 9.2 mg/dL (ref 8.9–10.3)
Chloride: 102 mmol/L (ref 98–111)
Creatinine, Ser: 0.79 mg/dL (ref 0.44–1.00)
GFR, Estimated: >60 mL/min (ref >60)
Glucose, Bld: 88 mg/dL (ref 70–99)
Potassium: 3.7 mmol/L (ref 3.5–5.1)
Sodium: 139 mmol/L (ref 135–145)

## 2022-07-11 LAB — CBC
HCT: 36.4 % (ref 36.0–46.0)
Hemoglobin: 12.5 g/dL (ref 12.0–15.0)
MCH: 33 pg (ref 26.0–34.0)
MCHC: 34.3 g/dL (ref 30.0–36.0)
MCV: 96 fL (ref 80.0–100.0)
Platelets: 350 10*3/uL (ref 150–400)
RBC: 3.79 MIL/uL — ABNORMAL LOW (ref 3.87–5.11)
RDW: 13.1 % (ref 11.5–15.5)
WBC: 3.2 10*3/uL — ABNORMAL LOW (ref 4.0–10.5)
nRBC: 0 % (ref 0.0–0.2)

## 2022-07-11 LAB — TROPONIN I (HIGH SENSITIVITY): Troponin I (High Sensitivity): <2 ng/L (ref <18)

## 2022-07-11 NOTE — ED Triage Notes (Signed)
Pt reports burning across her chest and some SOB. States pain as been ongoing since Thursday. Reports some cough. Denies recent fever. Pt reports generalized weakness since Friday. VSS.

## 2022-07-12 LAB — TROPONIN I (HIGH SENSITIVITY): Troponin I (High Sensitivity): <2 ng/L (ref <18)

## 2022-07-12 NOTE — ED Notes (Signed)
Pt stated she just going to go head and leave

## 2022-08-04 ENCOUNTER — Ambulatory Visit
Admission: EM | Admit: 2022-08-04 | Discharge: 2022-08-04 | Disposition: A | Discharge status: Home / Self Care | Payer: Medicaid Other | Attending: Internal Medicine | Admitting: Internal Medicine

## 2022-08-04 DIAGNOSIS — R053 Chronic cough: Secondary | ICD-10-CM

## 2022-08-04 DIAGNOSIS — J029 Acute pharyngitis, unspecified: Secondary | ICD-10-CM

## 2022-08-04 DIAGNOSIS — Z113 Encounter for screening for infections with a predominantly sexual mode of transmission: Secondary | ICD-10-CM

## 2022-08-04 DIAGNOSIS — Z3202 Encounter for pregnancy test, result negative: Secondary | ICD-10-CM | POA: Diagnosis present

## 2022-08-04 LAB — POCT URINE PREGNANCY: Preg Test, Ur: NEGATIVE

## 2022-08-04 LAB — POCT RAPID STREP A (OFFICE): Rapid Strep A Screen: NEGATIVE

## 2022-08-04 MED ORDER — BENZONATATE 100 MG PO CAPS: 100.0000 mg | ORAL_CAPSULE | Freq: Three times a day (TID) | ORAL | 0 refills | Status: DC | PRN

## 2022-08-04 MED ORDER — FLUTICASONE PROPIONATE 50 MCG/ACT NA SUSP
1.0000 | Freq: Every day | NASAL | 0 refills | Status: DC
Start: 2022-08-04 — End: 2023-01-12

## 2022-08-04 MED ORDER — CETIRIZINE HCL 10 MG PO TABS: 10.0000 mg | ORAL_TABLET | Freq: Every day | ORAL | 0 refills | Status: DC

## 2022-08-04 NOTE — ED Provider Notes (Signed)
[facility]    CSN: [id number] Arrival date & time: 08/04/22  0859      History   Chief Complaint Chief Complaint  Patient presents with   Cough    HPI Linda Rivas is a 32 y.o. female.   Patient presents today for several different chief complaints.  Reports that she has had cough, nasal congestion, sore throat intermittently for the past 3 to 4 weeks.  Reports feelings of chest congestion.  Denies any known sick contacts or fever.  Patient has not taken any medications for symptoms.  Denies chest pain, shortness of breath, gastrointestinal symptoms.  Patient is attributing symptoms to allergies.  She reports that symptoms started when it was raining, then resolved.  Then the symptoms returned again when it started raining again.  Patient also presenting today for routine STD testing and pregnancy testing.  Reports no exposure to STD and no associated symptoms.  Also requesting pregnancy testing.  Last menstrual cycle was 06/28/2022.  Reports that she typically has monthly menstrual cycles.  Patient wishes to have cytology swab of the throat as well given that she has performed oral intercourse.    Cough   Past Medical History:  Diagnosis Date   Abnormal Pap smear 2010   Chlamydia    Peanut allergy    Polydactyly of fingers    Rh negative status during pregnancy    Received Rhophylac at 29 weeks.    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 Authorizing Provider  benzonatate (TESSALON) 100 MG capsule Take 1 capsule (100 mg total) by mouth every 8 (eight) hours as needed for cough. 08/04/22  Yes Margreat Widener, Acie Fredrickson, FNP  cetirizine (ZYRTEC) 10 MG tablet Take 1 tablet (10 mg total) by mouth daily. 08/04/22  Yes Hodaya Curto, Rolly Salter E, FNP  fluticasone (FLONASE) 50 MCG/ACT nasal spray Place 1 spray into both nostrils daily. 08/04/22  Yes Maliea Grandmaison, Rolly Salter E, FNP  fluconazole (DIFLUCAN) 150 MG tablet Take 1 tablet (150 mg total) by mouth every 3 (three) days. 11/06/21   Gustavus Bryant, FNP  metroNIDAZOLE (FLAGYL) 500 MG tablet Take 1 tablet (500 mg total) by mouth 2 (two) times daily. 09/22/21   Corlis Hove, NP  metroNIDAZOLE (FLAGYL) 500 MG tablet Take 1 tablet (500 mg total) by mouth 2 (two) times daily. 11/06/21   Ervin Knack  E, FNP    Family History Family History  Problem Relation Age of Onset   Hypertension Mother    Lupus Mother    Hypertension Father    Prostate cancer Father    Heart attack Father     Social History Social History   Tobacco Use   Smoking status: Never   Smokeless tobacco: Never  Substance Use Topics   Alcohol use: Yes    Alcohol/week: 1.0 standard drink of alcohol    Types: 1 Glasses of wine per week    Comment: 3 days week/wine   Drug use: No    Types: Marijuana    Comment: past use     Allergies   Peanuts [peanut oil]   Review of Systems Review of Systems Per HPI  Physical Exam Triage Vital Signs ED Triage Vitals  Enc Vitals Group     BP 08/04/22 0917 (!) 133/95     Pulse Rate 08/04/22 0917 89     Resp 08/04/22 0917 16     Temp 08/04/22 0917 98.2 F (36.8 C)     Temp Source 08/04/22 0917 Oral      SpO2 08/04/22 0917 98 %     Weight --      Height --      Head Circumference --      Peak Flow --      Pain Score 08/04/22 0918 5     Pain Loc --      Pain Edu? --      Excl. in GC? --    No data found.  Updated Vital Signs BP (!) 133/95 (BP Location: Left Arm)   Pulse 89   Temp 98.2 F (36.8 C) (Oral)   Resp 16   LMP 06/28/2022   SpO2 98%   Visual Acuity Right Eye Distance:   Left Eye Distance:   Bilateral Distance:    Right Eye Near:   Left Eye Near:    Bilateral Near:     Physical Exam Constitutional:      General: She is not in acute distress.    Appearance: Normal appearance. She is not toxic-appearing or diaphoretic.  HENT:     Head: Normocephalic and atraumatic.     Right Ear: Tympanic membrane and ear canal normal.     Left Ear: Tympanic membrane and ear canal normal.     Nose: Congestion present.     Mouth/Throat:     Mouth: Mucous membranes are moist.     Pharynx: Posterior oropharyngeal erythema present.  Eyes:     Extraocular Movements: Extraocular movements intact.     Conjunctiva/sclera: Conjunctivae normal.     Pupils: Pupils are equal, round, and reactive to light.  Cardiovascular:     Rate and Rhythm: Normal rate and regular rhythm.     Pulses: Normal pulses.     Heart sounds: Normal heart sounds.  Pulmonary:     Effort: Pulmonary effort is normal. No respiratory distress.     Breath sounds: Normal breath sounds. No stridor. No wheezing, rhonchi or rales.  Abdominal:     General: Abdomen is flat. Bowel sounds are normal.     Palpations: Abdomen is soft.  Genitourinary:    Comments: Deferred with shared decision making. Self swab performed.  Musculoskeletal:        General: Normal range of motion.     Cervical back: Normal range of motion.  Skin:    General: Skin is warm and dry.  Neurological:  General: No focal deficit present.     Mental Status: She is alert and oriented to person, place, and time. Mental status is at baseline.   Psychiatric:        Mood and Affect: Mood normal.        Behavior: Behavior normal.      UC Treatments / Results  Labs (all labs ordered are listed, but only abnormal results are displayed) Labs Reviewed  CULTURE, GROUP A STREP ([facility])  HIV ANTIBODY (ROUTINE TESTING W REFLEX)  RPR  POCT RAPID STREP A (OFFICE)  POCT URINE PREGNANCY  CERVICOVAGINAL ANCILLARY ONLY  CYTOLOGY, (ORAL, ANAL, URETHRAL) ANCILLARY ONLY    EKG   Radiology No results found.  Procedures Procedures (including critical care time)  Medications Ordered in UC Medications - No data to display  Initial Impression / Assessment and Plan / UC Course  I have reviewed the triage vital signs and the nursing notes.  Pertinent labs & imaging results that were available during my care of the patient were reviewed by me and considered in my medical decision making (see chart for details).     1.  Upper respiratory symptoms and cough  Differential diagnoses include allergic rhinitis versus viral bronchitis versus postviral cough. patient declined chest x-ray with shared decision making.  There are no adventitious lung sounds on exam so I do think that is reasonable.  Will treat with cetirizine antihistamine, benzonatate for cough, and Flonase.  No signs of bacterial infection on exam that would necessitate antibiotic therapy.  Advised strict follow-up if any symptoms persist or worsen.  2.  STD testing and pregnancy testing  Cervicovaginal swab, oral cytology swab, HIV, RPR test pending.  Patient reports no exposure to STD and no associated symptoms so will await results for any further treatment if necessary.  Patient advised to refrain from sexual activity until test results are complete and treatment if necessary.  Urine pregnancy test was negative.  Patient advised to follow-up with her established gynecologist if she continues to miss menstrual cycle.  Discussed return precautions.  Patient verbalized  understanding and was agreeable with plan. Final Clinical Impressions(s) / UC Diagnoses   Final diagnoses:  Screening examination for venereal disease  Sore throat  Persistent cough for 3 weeks or longer  Urine pregnancy test negative     Discharge Instructions      I have sent you 3 medications to help alleviate your upper respiratory symptoms, sore throat, cough. Please follow-up if any symptoms persist or worsen.  Your rapid strep test was negative.  Throat culture is pending.  Will call if they are abnormal.  Your STD testing is pending including vaginal swab, throat swab, blood work for HIV and syphilis.  We will call if anything is abnormal and put you on the appropriate treatment if necessary.  We ask that you refrain from sexual activity until test results and treatment are complete.  Urine pregnancy test was negative.  If you continue to miss your menstrual cycle, please follow-up with your gynecologist for further testing.     ED Prescriptions     Medication Sig Dispense Auth. Provider   cetirizine (ZYRTEC) 10 MG tablet Take 1 tablet (10 mg total) by mouth daily. 30 tablet [place], [place], [place]   fluticasone [facility] 50 MCG/ACT nasal spray Place 1 spray into both nostrils daily. 16 g Terran Klinke, Rolly Salter E, [place]   benzonatate (TESSALON) 100 MG capsule Take 1 capsule (100 mg total) by mouth every 8 (eight) hours  as needed for cough. 21 capsule [place], Acie Fredrickson, [place]      PDMP not reviewed this encounter.   Gustavus Bryant, [place] 08/04/22 1026

## 2022-08-04 NOTE — ED Triage Notes (Signed)
Pt c/o cough, sore throat,   Onset ~ 3-4 weeks ago

## 2022-08-04 NOTE — Discharge Instructions (Signed)
I have sent you 3 medications to help alleviate your upper respiratory symptoms, sore throat, cough. Please follow-up if any symptoms persist or worsen.  Your rapid strep test was negative.  Throat culture is pending.  Will call if they are abnormal.  Your STD testing is pending including vaginal swab, throat swab, blood work for HIV and syphilis.  We will call if anything is abnormal and put you on the appropriate treatment if necessary.  We ask that you refrain from sexual activity until test results and treatment are complete.  Urine pregnancy test was negative.  If you continue to miss your menstrual cycle, please follow-up with your gynecologist for further testing.

## 2022-08-05 LAB — CERVICOVAGINAL ANCILLARY ONLY
Bacterial Vaginitis (gardnerella): POSITIVE — AB
Candida Glabrata: NEGATIVE
Candida Vaginitis: POSITIVE — AB
Chlamydia: NEGATIVE
Comment: NEGATIVE
Comment: NEGATIVE
Comment: NEGATIVE
Comment: NEGATIVE
Comment: NEGATIVE
Comment: NORMAL
Neisseria Gonorrhea: NEGATIVE
Trichomonas: NEGATIVE

## 2022-08-05 LAB — RPR: RPR Ser Ql: NONREACTIVE

## 2022-08-05 LAB — HIV ANTIBODY (ROUTINE TESTING W REFLEX): HIV Screen 4th Generation wRfx: NONREACTIVE

## 2022-08-06 LAB — CULTURE, GROUP A STREP (THRC)

## 2022-08-06 LAB — CYTOLOGY, (ORAL, ANAL, URETHRAL) ANCILLARY ONLY
Chlamydia: NEGATIVE
Comment: NEGATIVE
Comment: NEGATIVE
Comment: NORMAL
Neisseria Gonorrhea: NEGATIVE
Trichomonas: NEGATIVE

## 2022-08-09 ENCOUNTER — Telehealth (HOSPITAL_COMMUNITY): Payer: Self-pay | Admitting: Internal Medicine

## 2022-08-09 MED ORDER — FLUCONAZOLE 150 MG PO TABS: 150.0000 mg | ORAL_TABLET | ORAL | 0 refills | Status: DC

## 2022-08-09 MED ORDER — METRONIDAZOLE 500 MG PO TABS: 500.0000 mg | ORAL_TABLET | Freq: Two times a day (BID) | ORAL | 0 refills | Status: DC

## 2022-08-09 NOTE — Telephone Encounter (Signed)
Patient tested positive for BV and vaginal yeast on vaginal swab.  Prescriptions sent to pharmacy.  Patient notified.  All questions answered.

## 2022-11-04 ENCOUNTER — Other Ambulatory Visit: Payer: Self-pay

## 2022-11-04 ENCOUNTER — Ambulatory Visit
Admission: EM | Admit: 2022-11-04 | Discharge: 2022-11-04 | Disposition: A | Discharge status: Home / Self Care | Payer: Medicaid Other | Attending: Physician Assistant | Admitting: Physician Assistant

## 2022-11-04 ENCOUNTER — Other Ambulatory Visit: Payer: Self-pay | Admitting: Physician Assistant

## 2022-11-04 DIAGNOSIS — Z113 Encounter for screening for infections with a predominantly sexual mode of transmission: Secondary | ICD-10-CM

## 2022-11-04 DIAGNOSIS — N898 Other specified noninflammatory disorders of vagina: Secondary | ICD-10-CM | POA: Diagnosis not present

## 2022-11-04 LAB — POCT URINE PREGNANCY: Preg Test, Ur: NEGATIVE

## 2022-11-04 NOTE — ED Provider Notes (Signed)
[facility]    CSN: [id number] Arrival date & time: 11/04/22  1035      History   Chief Complaint Chief Complaint  Patient presents with   SEXUALLY TRANSMITTED DISEASE    Testing     HPI Linda Rivas is a 32 y.o. female.   Patient presents today for STD screening.  She states she is having vaginal discharge and is late for her period.  She denies any known STD exposure.  She has not had any genital lesions.  She denies any abdominal pain, back pain, nausea, vomiting or fever.  The history is provided by the patient.    Past Medical History:  Diagnosis Date   Abnormal Pap smear 2010   Chlamydia    Peanut allergy    Polydactyly of fingers    Rh negative status during pregnancy    Received Rhophylac at 29 weeks.    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 Authorizing Provider  benzonatate (TESSALON) 100 MG capsule Take 1 capsule (100 mg total) by mouth every 8 (eight) hours as needed for cough. 08/04/22   Gustavus Bryant, FNP  cetirizine (ZYRTEC) 10 MG tablet Take 1 tablet (10 mg total) by mouth daily. 08/04/22   Gustavus Bryant, FNP  fluconazole  (DIFLUCAN) 150 MG tablet Take 1 tablet (150 mg total) by mouth every 3 (three) days. 08/09/22   Gustavus Bryant, FNP  fluticasone (FLONASE) 50 MCG/ACT nasal spray Place 1 spray into both nostrils daily. 08/04/22   Gustavus Bryant, FNP  metroNIDAZOLE (FLAGYL) 500 MG tablet Take 1 tablet (500 mg total) by mouth 2 (two) times daily. 08/09/22   Gustavus Bryant, FNP    Family History Family History  Problem Relation Age of Onset   Hypertension Mother    Lupus Mother    Hypertension Father    Prostate cancer Father    Heart attack Father     Social History Social History   Tobacco Use   Smoking status: Never   Smokeless tobacco: Never  Vaping Use   Vaping status: Never Used  Substance Use Topics   Alcohol use: Yes    Alcohol/week: 1.0 standard drink of alcohol    Types: 1 Glasses of wine per week    Comment: 3 days week/wine   Drug use: No  Types: Marijuana    Comment: past use     Allergies   Peanut oil and Peanut-containing drug products   Review of Systems Review of Systems  Constitutional:  Negative for chills and fever.  Eyes:  Negative for discharge and redness.  Respiratory:  Negative for shortness of breath.   Gastrointestinal:  Negative for abdominal pain, nausea and vomiting.  Genitourinary:  Positive for vaginal discharge. Negative for dysuria and genital sores.  Musculoskeletal:  Negative for back pain.     Physical Exam Triage Vital Signs ED Triage Vitals  Encounter Vitals Group     BP      Systolic BP Percentile      Diastolic BP Percentile      Pulse      Resp      Temp      Temp src      SpO2      Weight      Height      Head Circumference      Peak Flow      Pain Score      Pain Loc      Pain Education      Exclude from Growth Chart    No data found.  Updated Vital Signs BP 110/68 (BP Location: Right Arm)   Pulse 70   Temp 97.7 F (36.5 C) (Oral)   Resp 16   Ht 5\' 1"  (1.549 m)   Wt 102 lb (46.3 kg)   LMP 10/04/2022 (Exact Date)    SpO2 98%   BMI 19.27 kg/m      Physical Exam Vitals and nursing note reviewed.  Constitutional:      General: She is not in acute distress.    Appearance: Normal appearance. She is not ill-appearing.  HENT:     Head: Normocephalic and atraumatic.  Eyes:     Conjunctiva/sclera: Conjunctivae normal.  Cardiovascular:     Rate and Rhythm: Normal rate.  Pulmonary:     Effort: Pulmonary effort is normal. No respiratory distress.  Genitourinary:    Comments: Self swab performed Neurological:     Mental Status: She is alert.  Psychiatric:        Mood and Affect: Mood normal.        Behavior: Behavior normal.        Thought Content: Thought content normal.      UC Treatments / Results  Labs (all labs ordered are listed, but only abnormal results are displayed) Labs Reviewed  RPR  HIV ANTIBODY (ROUTINE TESTING W REFLEX)  HEPATITIS PANEL, ACUTE  POCT URINE PREGNANCY  CYTOLOGY, (ORAL, ANAL, URETHRAL) ANCILLARY ONLY    EKG   Radiology No results found.  Procedures Procedures (including critical care time)  Medications Ordered in UC Medications - No data to display  Initial Impression / Assessment and Plan / UC Course  I have reviewed the triage vital signs and the nursing notes.  Pertinent labs & imaging results that were available during my care of the patient were reviewed by me and considered in my medical decision making (see chart for details).    STD screen ordered.  Will await results further recommendation.  Discussed negative pregnancy test in office.  Courage follow-up for any further concerns.  Final Clinical Impressions(s) / UC Diagnoses   Final diagnoses:  Vaginal discharge  Screen for STD (sexually transmitted disease)   Discharge Instructions   None    ED Prescriptions   None    PDMP not  reviewed this encounter.   Tomi Bamberger, PA-C 11/04/22 1108

## 2022-11-04 NOTE — ED Triage Notes (Signed)
"  I need STI testing, I am having Vaginal discharge". "Menses, is late". No dysuria.

## 2022-11-08 LAB — MOLECULAR ANCILLARY ONLY
Chlamydia: NEGATIVE
Comment: NEGATIVE
Comment: NEGATIVE
Comment: NORMAL
Neisseria Gonorrhea: NEGATIVE
Trichomonas: NEGATIVE

## 2022-12-26 ENCOUNTER — Ambulatory Visit
Admission: EM | Admit: 2022-12-26 | Discharge: 2022-12-26 | Disposition: A | Discharge status: Home / Self Care | Payer: Medicaid Other | Attending: Physician Assistant | Admitting: Physician Assistant

## 2022-12-26 DIAGNOSIS — K5900 Constipation, unspecified: Secondary | ICD-10-CM | POA: Insufficient documentation

## 2022-12-26 DIAGNOSIS — R002 Palpitations: Secondary | ICD-10-CM | POA: Insufficient documentation

## 2022-12-26 DIAGNOSIS — Z113 Encounter for screening for infections with a predominantly sexual mode of transmission: Secondary | ICD-10-CM | POA: Diagnosis present

## 2022-12-26 DIAGNOSIS — R0789 Other chest pain: Secondary | ICD-10-CM | POA: Diagnosis present

## 2022-12-26 DIAGNOSIS — N898 Other specified noninflammatory disorders of vagina: Secondary | ICD-10-CM | POA: Insufficient documentation

## 2022-12-26 DIAGNOSIS — R109 Unspecified abdominal pain: Secondary | ICD-10-CM | POA: Insufficient documentation

## 2022-12-26 DIAGNOSIS — O26849 Uterine size-date discrepancy, unspecified trimester: Secondary | ICD-10-CM | POA: Insufficient documentation

## 2022-12-26 DIAGNOSIS — N941 Unspecified dyspareunia: Secondary | ICD-10-CM | POA: Insufficient documentation

## 2022-12-26 DIAGNOSIS — N760 Acute vaginitis: Secondary | ICD-10-CM | POA: Insufficient documentation

## 2022-12-26 DIAGNOSIS — Z6791 Unspecified blood type, Rh negative: Secondary | ICD-10-CM | POA: Insufficient documentation

## 2022-12-26 NOTE — ED Provider Notes (Signed)
[facility]    CSN: [id number] Arrival date & time: 12/26/22  1540      History   Chief Complaint Chief Complaint  Patient presents with   Palpitations    HPI Linda Rivas is a 32 y.o. female.   Patient presents today with a year-long history of palpitations that vary in intensity but became obvious and uncomfortable earlier today after being upset by our clinic.  She initially presented for STI screening but we have initiated our limited walking guideline based on the number of patients in the clinic.  She was then potentially going to go to our [facility] [employer] [facility] clinic but there was some confusion on the telephone and so came back to this clinic.  At that time she was told by nursing staff that she would need to return another day for STI screening given our limited walking guideline, however, this became very upsetting to patient and she had worsening palpitations.  She does report occasional shortness of breath as well as some chest discomfort.  Denies any lightheadedness.  She does drink some alcohol at night and has not noticed any association with the palpitation symptoms. She does not regularly drink caffeine.  She has never seen a cardiologist and does not currently have a primary care provider.  She does have a heavy menstrual cycle and reports having to replace her personal hygiene product 3-4 times per day during her heavy days.  Her last menstrual cycle was 12/05/2022 according to triage.  She denies any melena or hematochezia.  She does report her mother has a history of palpitations but is unsure the underlying diagnosis.  Patient denies any significant past medical history including known anemia or thyroid dysfunction.  She does have a stressful job and is a single mother and reports that stress tends to trigger her symptoms.    Past Medical History:  Diagnosis Date   Abnormal Pap smear 2010   Chlamydia    Peanut allergy    Polydactyly of fingers    Rh negative  status during pregnancy    Received Rhophylac at 29 weeks.    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   Rh negative status during pregnancy    Pregnancy affected by fetal growth restriction 11/29/2012    Past Surgical History:  Procedure Laterality Date   ROOT CANAL     UMBILICAL HERNIA REPAIR N/A 08/22/2013   Procedure: HERNIA REPAIR UMBILICAL ;  Surgeon: Cherylynn Ridges, MD;  Location: [facility] OR;  Service: General;  Laterality: N/A;   WISDOM TOOTH EXTRACTION      OB History     Gravida  1   Para  1   Term  1   Preterm      AB      Living  1      SAB      IAB      Ectopic      Multiple      Live Births  1  Home Medications    Prior to Admission medications   Medication Sig Start Date End Date Taking? Authorizing Provider  fluconazole (DIFLUCAN) 150 MG tablet Take 150 mg by mouth once. 12/20/12  Yes [provider]  rho, d, immune globulin (RHOGAM ULTRA-FILTERED PLUS) 1500 units SOSY injection Inject 300 mcg into the muscle once. 09/27/12  Yes [provider]  benzonatate (TESSALON) 100 MG capsule Take 1 capsule (100 mg total) by mouth every 8 (eight) hours as needed for cough. 08/04/22   Gustavus Bryant, FNP  cetirizine (ZYRTEC) 10 MG tablet Take 1 tablet (10 mg total) by mouth daily. 08/04/22   Gustavus Bryant, FNP  cyclobenzaprine (FLEXERIL) 10 MG tablet Take 10 mg by mouth 3 (three) times daily as needed for muscle spasms.    [provider]  fluconazole (DIFLUCAN)  150 MG tablet Take 1 tablet (150 mg total) by mouth every 3 (three) days. 08/09/22   Gustavus Bryant, FNP  fluticasone (FLONASE) 50 MCG/ACT nasal spray Place 1 spray into both nostrils daily. 08/04/22   Gustavus Bryant, FNP  folic acid (FOLVITE) 400 MCG tablet Take 400 mcg by mouth in the morning and at bedtime.    [provider]  HYDROcodone-acetaminophen (NORCO/VICODIN) 5-325 MG tablet Take 1-2 tablets by mouth every 4 (four) hours as needed for moderate pain or severe pain.    [provider]  ibuprofen (ADVIL) 600 MG tablet Take 1 tablet by mouth every 6 (six) hours as needed.    [provider]  metroNIDAZOLE (FLAGYL) 500 MG tablet Take 1 tablet (500 mg total) by mouth 2 (two) times daily. 08/09/22   Gustavus Bryant, FNP  naproxen (NAPROSYN) 500 MG tablet Take 1 tablet by mouth 2 (two) times daily with a meal.    [provider]  oxyCODONE-acetaminophen (PERCOCET/ROXICET) 5-325 MG tablet Take 1-2 tablets by mouth every 6 (six) hours as needed.    [provider]  traMADol (ULTRAM) 50 MG tablet Take 50 mg by mouth every 6 (six) hours as needed for moderate pain or severe pain.    [provider]    Family History Family History  Problem Relation Age of Onset   Hypertension Mother    Lupus Mother    Hypertension Father    Prostate cancer Father    Heart attack Father     Social History Social History   Tobacco Use   Smoking status: Never   Smokeless tobacco: Never  Vaping Use   Vaping status: Never Used  Substance Use Topics   Alcohol use: Yes    Alcohol/week: 1.0 standard drink of alcohol    Types: 1 Glasses of wine per week    Comment: 3 days week/wine   Drug use: No    Types: Marijuana    Comment: past use     Allergies   Peanut oil and Peanut-containing drug products   Review of Systems Review of Systems  Constitutional:  Positive for activity change. Negative for appetite change, fatigue and fever.  Respiratory:   Positive for shortness of breath. Negative for cough.   Cardiovascular:  Positive for palpitations. Negative for chest pain and leg swelling.  Neurological:  Negative for dizziness and light-headedness.     Physical Exam Triage Vital Signs ED Triage Vitals  Encounter Vitals Group     BP 12/26/22 1550 118/75     Systolic BP Percentile --      Diastolic BP Percentile --  Pulse Rate 12/26/22 1550 84     Resp 12/26/22 1550 16     Temp 12/26/22 1550 97.9 F (36.6 C)     Temp Source 12/26/22 1550 Oral     SpO2 12/26/22 1550 96 %     Weight 12/26/22 1549 103 lb (46.7 kg)     Height 12/26/22 1549 5\' 1"  (1.549 m)     Head Circumference --      Peak Flow --      Pain Score 12/26/22 1544 4     Pain Loc --      Pain Education --      Exclude from Growth Chart --    No data found.  Updated Vital Signs BP 118/75 (BP Location: Right Arm)   Pulse 84   Temp 97.9 F (36.6 C) (Oral)   Resp 16   Ht 5\' 1"  (1.549 m)   Wt 103 lb (46.7 kg)   LMP 12/05/2022 (Exact Date)   SpO2 96%   BMI 19.46 kg/m   Visual Acuity Right Eye Distance:   Left Eye Distance:   Bilateral Distance:    Right Eye Near:   Left Eye Near:    Bilateral Near:     Physical Exam Vitals reviewed.  Constitutional:      General: She is awake. She is not in acute distress.    Appearance: Normal appearance. She is well-developed. She is not ill-appearing.     Comments: Appears stated age no acute distress  HENT:     Head: Normocephalic and atraumatic.  Cardiovascular:     Rate and Rhythm: Normal rate and regular rhythm.     Heart sounds: Normal heart sounds, S1 normal and S2 normal. No murmur heard. Pulmonary:     Effort: Pulmonary effort is normal.     Breath sounds: Normal breath sounds. No wheezing, rhonchi or rales.     Comments: Clear to auscultation bilaterally Chest:     Chest wall: Tenderness present. No deformity or swelling.     Comments: Tenderness over anterior chest wall.  Cardiac impulse  palpable through chest wall. Psychiatric:        Behavior: Behavior is cooperative.      UC Treatments / Results  Labs (all labs ordered are listed, but only abnormal results are displayed) Labs Reviewed  COMPREHENSIVE METABOLIC PANEL  CBC WITH DIFFERENTIAL/PLATELET  TSH  T4, FREE  HIV ANTIBODY (ROUTINE TESTING W REFLEX)  RPR  CERVICOVAGINAL ANCILLARY ONLY    EKG   Radiology No results found.  Procedures Procedures (including critical care time)  Medications Ordered in UC Medications - No data to display  Initial Impression / Assessment and Plan / UC Course  I have reviewed the triage vital signs and the nursing notes.  Pertinent labs & imaging results that were available during my care of the patient were reviewed by me and considered in my medical decision making (see chart for details).    Patient was very upset about our limited walk-in policy and believes that it is unfair as STIs are also an emergency.  I attempted to explain how the policy works and that we have keywords that trigger additional workup even if the policy has been enacted to avoid missing an emergent situation but that generally we do not consider STI testing an emergency.  Unfortunately, I was unable to get patient to understand the purpose and use of this policy.  She requested written copies of the policy and so I provided her with  the contact information for patient experience as well as recommended she call our office to speak with the assistant director if she has additional concerns.  She was evaluated for palpitations. EKG was obtained that showed normal sinus rhythm with ventricular rate of 81 bpm without ischemic changes; compared to 07/12/2022 tracing there was no significant change.  Basic blood work to investigate potential causes of palpitations was obtained including CBC, CMP, TSH, free T4.  We will contact her if any of this is abnormal.  Given her year-long history of symptoms I did  recommend she follow-up with cardiology to consider Holter monitor and she was given contact information for local provider with instruction to call to schedule an appointment.  She does not have a primary care provider so we will try to establish her with someone via PCP assistance.  Her primary concern today was STI testing and so ultimately this was obtained.  We will contact her if it is abnormal.  Discussed that if she has any worsening or changing symptoms she needs to be seen emergently.  Strict return precautions given.  Final Clinical Impressions(s) / UC Diagnoses   Final diagnoses:  Palpitations  Chest tightness  Screening examination for STI     Discharge Instructions      Patient experience: [phone]  For our assistant director call the clinic at [phone] and ask to leave a message with them. They will then call you back.   Please call cardiology as we discussed.   If anything changes and you develop chest pain, shortness of breath, worsening palpitations, lightheadedness, passing out you need to go to the ER.  Someone should call you to schedule an appointment with primary care.     ED Prescriptions   None    PDMP not reviewed this encounter.   Jeani Hawking, PA-C 12/26/22 1719

## 2022-12-26 NOTE — ED Triage Notes (Signed)
Upon pulling patient back to Room 4 for intake (she is on the phone to [place] to report situation).  "I feel like I am having palpitations and my heart is not in a regular rhythm, first noticed earlier in the year, stress makes this worse". No sob. No wheezing. No chest pain.

## 2022-12-26 NOTE — Discharge Instructions (Signed)
Patient experience: [phone]  For our assistant director call the clinic at [phone] and ask to leave a message with them. They will then call you back.   Please call cardiology as we discussed.   If anything changes and you develop chest pain, shortness of breath, worsening palpitations, lightheadedness, passing out you need to go to the ER.  Someone should call you to schedule an appointment with primary care.

## 2022-12-27 LAB — CBC WITH DIFFERENTIAL/PLATELET
Basophils Absolute: 0 10*3/uL (ref 0.0–0.2)
Basos: 1 %
EOS (ABSOLUTE): 0.1 10*3/uL (ref 0.0–0.4)
Eos: 2 %
Hematocrit: 35.4 % (ref 34.0–46.6)
Hemoglobin: 12.1 g/dL (ref 11.1–15.9)
Immature Grans (Abs): 0 10*3/uL (ref 0.0–0.1)
Immature Granulocytes: 0 %
Lymphocytes Absolute: 1.2 10*3/uL (ref 0.7–3.1)
Lymphs: 48 %
MCH: 33.6 pg — ABNORMAL HIGH (ref 26.6–33.0)
MCHC: 34.2 g/dL (ref 31.5–35.7)
MCV: 98 fL — ABNORMAL HIGH (ref 79–97)
Monocytes Absolute: 0.5 10*3/uL (ref 0.1–0.9)
Monocytes: 17 %
Neutrophils Absolute: 0.8 10*3/uL — ABNORMAL LOW (ref 1.4–7.0)
Neutrophils: 32 %
Platelets: 269 10*3/uL (ref 150–450)
RBC: 3.6 x10E6/uL — ABNORMAL LOW (ref 3.77–5.28)
RDW: 13.2 % (ref 11.7–15.4)
WBC: 2.6 10*3/uL — ABNORMAL LOW (ref 3.4–10.8)

## 2022-12-27 LAB — COMPREHENSIVE METABOLIC PANEL
ALT: 20 [IU]/L (ref 0–32)
AST: 70 [IU]/L — ABNORMAL HIGH (ref 0–40)
Albumin: 4.3 g/dL (ref 3.9–4.9)
Alkaline Phosphatase: 44 [IU]/L (ref 44–121)
BUN/Creatinine Ratio: 8 — ABNORMAL LOW (ref 9–23)
BUN: 6 mg/dL (ref 6–20)
Bilirubin Total: 0.3 mg/dL (ref 0.0–1.2)
CO2: 21 mmol/L (ref 20–29)
Calcium: 9 mg/dL (ref 8.7–10.2)
Chloride: 101 mmol/L (ref 96–106)
Creatinine, Ser: 0.72 mg/dL (ref 0.57–1.00)
Globulin, Total: 3 g/dL (ref 1.5–4.5)
Glucose: 97 mg/dL (ref 70–99)
Potassium: 3.3 mmol/L — ABNORMAL LOW (ref 3.5–5.2)
Sodium: 139 mmol/L (ref 134–144)
Total Protein: 7.3 g/dL (ref 6.0–8.5)
eGFR: 112 mL/min/{1.73_m2} (ref >59)

## 2022-12-27 LAB — TSH: TSH: 1.6 u[IU]/mL (ref 0.450–4.500)

## 2022-12-27 LAB — T4, FREE: Free T4: 0.9 ng/dL (ref 0.82–1.77)

## 2022-12-28 LAB — RPR: RPR Ser Ql: NONREACTIVE

## 2022-12-28 LAB — HIV ANTIBODY (ROUTINE TESTING W REFLEX): HIV Screen 4th Generation wRfx: NONREACTIVE

## 2022-12-29 ENCOUNTER — Telehealth: Payer: Self-pay

## 2022-12-29 LAB — CERVICOVAGINAL ANCILLARY ONLY
Bacterial Vaginitis (gardnerella): POSITIVE — AB
Candida Glabrata: NEGATIVE
Candida Vaginitis: NEGATIVE
Comment: NEGATIVE
Comment: NEGATIVE
Comment: NEGATIVE
Comment: NEGATIVE
Comment: NEGATIVE
Comment: NORMAL
Trichomonas: NEGATIVE

## 2022-12-29 MED ORDER — METRONIDAZOLE 500 MG PO TABS: 500.0000 mg | ORAL_TABLET | Freq: Two times a day (BID) | ORAL | 0 refills | Status: DC

## 2022-12-29 NOTE — Telephone Encounter (Signed)
Per protocol, pt requires tx with metronidazole. Reviewed with patient, verified pharmacy, prescription sent.

## 2023-01-07 ENCOUNTER — Ambulatory Visit
Admission: EM | Admit: 2023-01-07 | Discharge: 2023-01-07 | Disposition: A | Discharge status: Home / Self Care | Payer: Medicaid Other | Attending: Physician Assistant | Admitting: Physician Assistant

## 2023-01-07 DIAGNOSIS — N926 Irregular menstruation, unspecified: Secondary | ICD-10-CM | POA: Diagnosis not present

## 2023-01-07 DIAGNOSIS — Z3201 Encounter for pregnancy test, result positive: Secondary | ICD-10-CM

## 2023-01-07 LAB — POCT URINE PREGNANCY: Preg Test, Ur: POSITIVE — AB

## 2023-01-07 NOTE — ED Triage Notes (Signed)
Pt states she missed her period and had a positive home pregnancy test.  Would like to be tested here for confirmation.

## 2023-01-10 ENCOUNTER — Inpatient Hospital Stay (HOSPITAL_COMMUNITY)
Admission: AD | Admit: 2023-01-10 | Discharge: 2023-01-10 | Disposition: A | Discharge status: Home / Self Care | Payer: Medicaid Other | Attending: Family Medicine | Admitting: Family Medicine

## 2023-01-10 ENCOUNTER — Encounter (HOSPITAL_COMMUNITY): Payer: Self-pay | Admitting: Family Medicine

## 2023-01-10 ENCOUNTER — Inpatient Hospital Stay (HOSPITAL_COMMUNITY): Payer: Medicaid Other

## 2023-01-10 DIAGNOSIS — O3680X0 Pregnancy with inconclusive fetal viability, not applicable or unspecified: Secondary | ICD-10-CM | POA: Diagnosis not present

## 2023-01-10 DIAGNOSIS — N939 Abnormal uterine and vaginal bleeding, unspecified: Secondary | ICD-10-CM

## 2023-01-10 DIAGNOSIS — Z3A01 Less than 8 weeks gestation of pregnancy: Secondary | ICD-10-CM | POA: Insufficient documentation

## 2023-01-10 DIAGNOSIS — O26891 Other specified pregnancy related conditions, first trimester: Secondary | ICD-10-CM | POA: Insufficient documentation

## 2023-01-10 DIAGNOSIS — O209 Hemorrhage in early pregnancy, unspecified: Secondary | ICD-10-CM | POA: Insufficient documentation

## 2023-01-10 LAB — CBC
HCT: 35.1 % — ABNORMAL LOW (ref 36.0–46.0)
Hemoglobin: 11.9 g/dL — ABNORMAL LOW (ref 12.0–15.0)
MCH: 33.2 pg (ref 26.0–34.0)
MCHC: 33.9 g/dL (ref 30.0–36.0)
MCV: 98 fL (ref 80.0–100.0)
Platelets: 242 10*3/uL (ref 150–400)
RBC: 3.58 MIL/uL — ABNORMAL LOW (ref 3.87–5.11)
RDW: 13.1 % (ref 11.5–15.5)
WBC: 5.4 10*3/uL (ref 4.0–10.5)
nRBC: 0 % (ref 0.0–0.2)

## 2023-01-10 LAB — WET PREP, GENITAL
Clue Cells Wet Prep HPF POC: NONE SEEN
Sperm: NONE SEEN
Trich, Wet Prep: NONE SEEN
WBC, Wet Prep HPF POC: <10 (ref <10)
Yeast Wet Prep HPF POC: NONE SEEN

## 2023-01-10 LAB — GC/CHLAMYDIA PROBE AMP (~~LOC~~) NOT AT ARMC
Chlamydia: NEGATIVE
Comment: NEGATIVE
Comment: NORMAL
Neisseria Gonorrhea: NEGATIVE

## 2023-01-10 LAB — URINALYSIS, ROUTINE W REFLEX MICROSCOPIC
Bilirubin Urine: NEGATIVE
Glucose, UA: NEGATIVE mg/dL
Hgb urine dipstick: NEGATIVE
Ketones, ur: NEGATIVE mg/dL
Leukocytes,Ua: NEGATIVE
Nitrite: NEGATIVE
Protein, ur: NEGATIVE mg/dL
Specific Gravity, Urine: 1.014 (ref 1.005–1.030)
pH: 6 (ref 5.0–8.0)

## 2023-01-10 LAB — ABO/RH
ABO/RH(D): A NEG
Antibody Screen: NEGATIVE

## 2023-01-10 LAB — HCG, QUANTITATIVE, PREGNANCY: hCG, Beta Chain, Quant, S: 167 m[IU]/mL — ABNORMAL HIGH (ref <5)

## 2023-01-10 NOTE — MAU Note (Signed)
Blood Bank called RN to alert RN that the pt is Rh negative with a blood type of A negative.  CNM aware. No new orders at this time.

## 2023-01-10 NOTE — MAU Provider Note (Signed)
History     CSN: [id number]  Arrival date and time: 01/10/23 0436   Event Date/Time   First Provider Initiated Contact with Patient 01/10/23 0503      Chief Complaint  Patient presents with   Abdominal Cramping   Linda Rivas , a  32 y.o. G1P1001 at Unknown presents to MAU with complaints of lower abdominal discomfort and spotting. Patient reports that she recently found out she was pregnant on Friday. She states that since then she notices left sided lower abdominal cramping and discomfort. She currently rates it a 7.5/10 and denies attempting to relieve symptoms. She reports that walking and standing makes it worse. She also noted light pink vaginal discharge that she only sees with wiping. She denies bight red vaginal bleeding, or passing clots. She denies other abnormal vaginal discharge.        Abdominal Cramping Pertinent negatives include no constipation, diarrhea, dysuria, fever, headaches, nausea or vomiting.    OB History     Gravida  1   Para  1   Term  1   Preterm      AB      Living  1      SAB      IAB      Ectopic      Multiple      Live Births  1           Past Medical History:  Diagnosis Date   Abnormal Pap smear 2010   Chlamydia    Peanut allergy    Polydactyly of fingers    Rh negative status during pregnancy    Received Rhophylac at 29 weeks.    Past Surgical History:  Procedure Laterality Date   ROOT CANAL     UMBILICAL HERNIA REPAIR N/A 08/22/2013   Procedure: HERNIA REPAIR UMBILICAL ;  Surgeon: Cherylynn Ridges, MD;  Location: [facility] OR;  Service: General;  Laterality: N/A;   WISDOM TOOTH EXTRACTION      Family History  Problem Relation Age of Onset   Hypertension Mother    Lupus Mother    Hypertension Father    Prostate cancer Father    Heart attack Father     Social History   Tobacco Use   Smoking status: Never   Smokeless tobacco: Never  Vaping Use   Vaping status: Never Used  Substance Use Topics    Alcohol use: Yes    Alcohol/week: 1.0 standard drink of alcohol    Types: 1 Glasses of wine per week    Comment: 3 days week/wine   Drug use: No    Types: Marijuana    Comment: past use    Allergies:  Allergies  Allergen Reactions   Peanut Oil Swelling   Peanut-Containing Drug Products Itching    Other Reaction(s): other, vomiting    Medications Prior to Admission  Medication Sig Dispense Refill Last Dose   benzonatate (TESSALON) 100 MG capsule Take 1 capsule (100 mg total) by mouth every 8 (eight) hours as needed for cough. 21 capsule 0    cetirizine (ZYRTEC) 10 MG tablet Take 1 tablet (10 mg total) by mouth daily. 30 tablet 0    cyclobenzaprine (FLEXERIL) 10 MG tablet Take 10 mg by mouth 3 (three) times daily as needed for muscle spasms.      fluconazole (DIFLUCAN) 150 MG tablet Take 1 tablet (150 mg total) by mouth every 3 (three) days. 2 tablet 0    fluconazole (DIFLUCAN) 150 MG tablet  Take 150 mg by mouth once.      fluticasone (FLONASE) 50 MCG/ACT nasal spray Place 1 spray into both nostrils daily. 16 g 0    folic acid (FOLVITE) 400 MCG tablet Take 400 mcg by mouth in the morning and at bedtime.      HYDROcodone-acetaminophen (NORCO/VICODIN) 5-325 MG tablet Take 1-2 tablets by mouth every 4 (four) hours as needed for moderate pain or severe pain.      ibuprofen (ADVIL) 600 MG tablet Take 1 tablet by mouth every 6 (six) hours as needed.      metroNIDAZOLE (FLAGYL) 500 MG tablet Take 1 tablet (500 mg total) by mouth 2 (two) times daily. 14 tablet 0    metroNIDAZOLE (FLAGYL) 500 MG tablet Take 1 tablet (500 mg total) by mouth 2 (two) times daily. 14 tablet 0    naproxen (NAPROSYN) 500 MG tablet Take 1 tablet by mouth 2 (two) times daily with a meal.      oxyCODONE-acetaminophen (PERCOCET/ROXICET) 5-325 MG tablet Take 1-2 tablets by mouth every 6 (six) hours as needed.      rho, d, immune globulin (RHOGAM ULTRA-FILTERED PLUS) 1500 units SOSY injection Inject 300 mcg into the muscle  once.      traMADol (ULTRAM) 50 MG tablet Take 50 mg by mouth every 6 (six) hours as needed for moderate pain or severe pain.       Review of Systems  Constitutional:  Negative for chills, fatigue and fever.  Eyes:  Negative for pain and visual disturbance.  Respiratory:  Negative for apnea, shortness of breath and wheezing.   Cardiovascular:  Negative for chest pain and palpitations.  Gastrointestinal:  Positive for abdominal pain. Negative for constipation, diarrhea, nausea and vomiting.  Genitourinary:  Positive for pelvic pain and vaginal bleeding. Negative for difficulty urinating, dysuria, vaginal discharge and vaginal pain.  Musculoskeletal:  Negative for back pain.  Neurological:  Negative for seizures, weakness and headaches.  Psychiatric/Behavioral:  Negative for suicidal ideas.    Physical Exam   Blood pressure 113/76, pulse 95, temperature 98.1 F (36.7 C), temperature source Oral, resp. rate 14, height 5\' 1"  (1.549 m), weight 46.3 kg, last menstrual period 12/05/2022, SpO2 100%.  Physical Exam Vitals and nursing note reviewed.  Constitutional:      General: She is not in acute distress.    Appearance: Normal appearance.  HENT:     Head: Normocephalic.  Pulmonary:     Effort: Pulmonary effort is normal.  Musculoskeletal:     Cervical back: Normal range of motion.  Skin:    General: Skin is warm and dry.  Neurological:     Mental Status: She is alert and oriented to person, place, and time.  Psychiatric:        Mood and Affect: Mood normal.     MAU Course  Procedures Orders Placed This Encounter  Procedures   Wet prep, genital   US OB LESS THAN 14 WEEKS WITH OB TRANSVAGINAL   Urinalysis, Routine w reflex microscopic -Urine, Clean Catch   CBC   hCG, quantitative, pregnancy   Diet NPO time specified   ABO/Rh   Results for orders placed or performed during the hospital encounter of 01/10/23 (from the past 24 hour(s))  Wet prep, genital     Status: None    Collection Time: 01/10/23  5:37 AM  Result Value Ref Range   Yeast Wet Prep HPF POC NONE SEEN NONE SEEN   Trich, Wet Prep NONE SEEN NONE SEEN  Clue Cells Wet Prep HPF POC NONE SEEN NONE SEEN   WBC, Wet Prep HPF POC <10 <10   Sperm NONE SEEN   CBC     Status: Abnormal   Collection Time: 01/10/23  5:41 AM  Result Value Ref Range   WBC 5.4 4.0 - 10.5 K/uL   RBC 3.58 (L) 3.87 - 5.11 MIL/uL   Hemoglobin 11.9 (L) 12.0 - 15.0 g/dL   HCT 86.5 (L) 78.4 - 69.6 %   MCV 98.0 80.0 - 100.0 fL   MCH 33.2 26.0 - 34.0 pg   MCHC 33.9 30.0 - 36.0 g/dL   RDW 29.5 28.4 - 13.2 %   Platelets 242 150 - 400 K/uL   nRBC 0.0 0.0 - 0.2 %  ABO/Rh     Status: None   Collection Time: 01/10/23  5:41 AM  Result Value Ref Range   ABO/RH(D) A NEG    Antibody Screen      NEG Performed at [facility], [address], [place], [place] [address]   hCG, quantitative, pregnancy     Status: Abnormal   Collection Time: 01/10/23  5:41 AM  Result Value Ref Range   hCG, Beta Chain, Quant, S 167 (H) <5 mIU/mL  Urinalysis, Routine w reflex microscopic -Urine, Clean Catch     Status: None   Collection Time: 01/10/23  5:45 AM  Result Value Ref Range   Color, Urine YELLOW YELLOW   APPearance CLEAR CLEAR   Specific Gravity, Urine 1.014 1.005 - 1.030   pH 6.0 5.0 - 8.0   Glucose, UA NEGATIVE NEGATIVE mg/dL   Hgb urine dipstick NEGATIVE NEGATIVE   Bilirubin Urine NEGATIVE NEGATIVE   Ketones, ur NEGATIVE NEGATIVE mg/dL   Protein, ur NEGATIVE NEGATIVE mg/dL   Nitrite NEGATIVE NEGATIVE   Leukocytes,Ua NEGATIVE NEGATIVE    . MDM - Wet prep and UA normal - Quant 167.  - Patient requesting to leave MAU for work.  - CNM awaiting [place] results for final read. Reviewed images with Dr. Shawnie Pons. Possible concern for ectopic pregnancy vs PUL. No IUP visualized today. At best have patient return to MAU for repeat quant in 48 hours with precautions.  - Reviewed options with patient and she is agreeable to plan of care.  -  Patient also reports that she will return to MAU for Methotrexate if needed.  - plan for discharge   Assessment and Plan   1. Pregnancy of unknown anatomic location   2. [redacted] weeks gestation of pregnancy   3. Vaginal spotting    - Reviewed risks of PUL versus Ectopic and the recommendation to either stay and await results or return if needed. Patient opted to return if needed.  - Reviewed worsening signs and return precautions.  - Appointment scheduled for repeat quant in 48 hours with [facility].  - Patient discharged home in stable condition and may return to MAU as needed.   Claudette Head, MSN CNM  01/10/2023, 5:21 AM

## 2023-01-10 NOTE — MAU Note (Signed)
Linda Rivas is a 32 y.o. at Unknown here in MAU reporting: Lower abdominal pain that started on the 8th. Pt states she thought it was her period coming but she found out she was pregnant on Friday 01/07/2023. Pt states the abdominal pain feels like cramping. Pt states she had a sudden stretching feeling in her pelvis today but that pain has went away. Pt denies VB or LOF.   Onset of complaint: 01/04/2023 Pain score: 7/10 ab pain  Vitals:   01/10/23 0452  BP: 113/76  Pulse: 95  Resp: 14  Temp: 98.1 F (36.7 C)  SpO2: 100%     FHT:N/A Lab orders placed from triage:  UA

## 2023-01-12 ENCOUNTER — Ambulatory Visit: Payer: Medicaid Other

## 2023-01-12 ENCOUNTER — Ambulatory Visit: Payer: Medicaid Other | Admitting: *Deleted

## 2023-01-12 ENCOUNTER — Other Ambulatory Visit: Payer: Self-pay

## 2023-01-12 ENCOUNTER — Telehealth: Payer: Self-pay | Admitting: *Deleted

## 2023-01-12 VITALS — BP 115/75 | HR 79 | Ht 61.0 in | Wt 106.7 lb

## 2023-01-12 DIAGNOSIS — Z3A01 Less than 8 weeks gestation of pregnancy: Secondary | ICD-10-CM

## 2023-01-12 DIAGNOSIS — O3680X0 Pregnancy with inconclusive fetal viability, not applicable or unspecified: Secondary | ICD-10-CM

## 2023-01-12 LAB — BETA HCG QUANT (REF LAB): hCG Quant: 256 m[IU]/mL

## 2023-01-12 NOTE — Telephone Encounter (Signed)
Shalese called and reported she knows she had an appointment at 27 and has been trying to reach someone by phone to change it. She reports she was in court but can come at 1:00. I informed I can change her appointment to 1:00 and we will see her then. I apologized for her difficulty reaching [place].  Nancy Fetter

## 2023-01-12 NOTE — Progress Notes (Signed)
Pt presents for stat BHCG.  She reports no longer having pink spotting. She is having some occasional abdominal cramping but not as much as as before. Pt advised that she will be called with test result later today and next steps in care.  She stated that a detailed voicemail message can be left if she does not answer.   1710  BHCG result (256) reviewed by Dr. Alvester Morin who finds the hormone level has not doubled and this is still concerning for possible ectopic in light of pt's pain. She recommends repeat stat BCHG on 10/18. Pt was called and informed of results as well as need for repeat BHCG.  Pt reports she has work conflict for 10/18 morning. I advised that the office is closed in the afternoon and she will need to present to MAU for the recommended lab test and evaluation. Pt was reminded of importance of returning to MAU sooner if she develops heavy vaginal bleeding or increased pain. She voiced understanding and agreed to plan of care.

## 2023-01-14 NOTE — Addendum Note (Signed)
Addended by: Geanie Berlin on: 01/14/2023 10:52 AM   Modules accepted: Level of Service

## 2023-01-18 ENCOUNTER — Inpatient Hospital Stay (HOSPITAL_COMMUNITY): Payer: Medicaid Other

## 2023-01-18 ENCOUNTER — Encounter (HOSPITAL_COMMUNITY): Payer: Self-pay | Admitting: Obstetrics and Gynecology

## 2023-01-18 ENCOUNTER — Inpatient Hospital Stay (HOSPITAL_COMMUNITY)
Admission: AD | Admit: 2023-01-18 | Discharge: 2023-01-18 | Disposition: A | Discharge status: Home / Self Care | Payer: Medicaid Other | Attending: Obstetrics and Gynecology | Admitting: Obstetrics and Gynecology

## 2023-01-18 DIAGNOSIS — Z3490 Encounter for supervision of normal pregnancy, unspecified, unspecified trimester: Secondary | ICD-10-CM

## 2023-01-18 DIAGNOSIS — Z3A01 Less than 8 weeks gestation of pregnancy: Secondary | ICD-10-CM | POA: Diagnosis not present

## 2023-01-18 DIAGNOSIS — O209 Hemorrhage in early pregnancy, unspecified: Secondary | ICD-10-CM | POA: Diagnosis present

## 2023-01-18 DIAGNOSIS — O3680X0 Pregnancy with inconclusive fetal viability, not applicable or unspecified: Secondary | ICD-10-CM | POA: Diagnosis not present

## 2023-01-18 LAB — HCG, QUANTITATIVE, PREGNANCY: hCG, Beta Chain, Quant, S: 2288 m[IU]/mL — ABNORMAL HIGH (ref <5)

## 2023-01-18 MED ORDER — PREPLUS 27-1 MG PO TABS
1.0000 | ORAL_TABLET | Freq: Every day | ORAL | 13 refills | Status: AC
Start: 2023-01-18 — End: ?

## 2023-01-18 NOTE — MAU Provider Note (Addendum)
History   Chief Complaint:  Vaginal Bleeding and Abdominal Pain   Linda Rivas is  32 y.o. G2P1001 Patient's last menstrual period was 12/05/2022 (exact date).. Patient is here for follow up of quantitative HCG and ongoing surveillance of pregnancy status. She is [redacted]w[redacted]d weeks gestation by LMP.    Since her last visit, the patient is with new complaint. The patient reports bleeding as  lighter than period.  She denies any pain.  General ROS:  positive for vaginal bleeding  Her previous Quantitative HCG values are:  Recent Labs  Lab 01/10/23 0541  HCGBETAQNT 167*    Physical Exam   Blood pressure 114/75, pulse 85, temperature 98 F (36.7 C), temperature source Oral, resp. rate 16, height 5' (1.524 m), weight 47.8 kg, last menstrual period 12/05/2022, SpO2 99%.  Focused Gynecological Exam:  Gen: well appearing, NAD HEENT: no scleral icterus CV: RR Lung: Normal WOB Ext: warm well perfused   Labs: Results for orders placed or performed during the hospital encounter of 01/18/23 (from the past 24 hour(s))  hCG, quantitative, pregnancy   Collection Time: 01/18/23  7:01 PM  Result Value Ref Range   hCG, Beta Chain, Quant, S 2,288 (H) <5 mIU/mL    Ultrasound Studies:   US OB LESS THAN 14 WEEKS WITH OB TRANSVAGINAL  Result Date: 01/10/2023 CLINICAL DATA:  Abdominal pain with positive pregnancy test. EXAM: OBSTETRIC <14 WK [place] AND TRANSVAGINAL OB US TECHNIQUE: Both transabdominal and transvaginal ultrasound examinations were performed for complete evaluation of the gestation as well as the maternal uterus, adnexal regions, and pelvic cul-de-sac. Transvaginal technique was performed to assess early pregnancy. COMPARISON:  None Available. FINDINGS: Intrauterine gestational sac: None. Yolk sac:  Not Visualized. Embryo:  Not Visualized. Cardiac Activity: Not Visualized. Heart Rate: N/A  bpm Subchorionic hemorrhage:  None visualized. Maternal uterus/adnexae: No adnexal mass. No  intraperitoneal free fluid. IMPRESSION: No evidence for intrauterine gestational sac. No adnexal mass lesion. Given the history of a positive pregnancy test, differential considerations include intrauterine gestation too early to visualize, completed abortion, or nonvisualized ectopic pregnancy. Close clinical correlation is recommended with serial beta-hCG and followup ultrasound as warranted. Electronically Signed   By: Kennith Center M.D.   On: 01/10/2023 07:25    MDM: high  This patient presents to the ED for concern of   Chief Complaint  Patient presents with   Vaginal Bleeding   Abdominal Pain     These complains involves an extensive number of treatment options, and is a complaint that carries with it a high risk of complications and morbidity.  The differential diagnosis for  1.vaginal bleeding in early pregnancy INCLUDES threatened miscarriage, ectopic pregnancy (unless IUP confirmed), normal variant bleeding with live IUP-mostly likely subchorionic hemorrhage in this case. Most likely for this patient is normal variant.    Assessment:   1. Pregnancy with uncertain fetal viability, single or unspecified fetus   2. Intrauterine pregnancy   3. Early stage of pregnancy   4. [redacted] weeks gestation of pregnancy      Report given to and care assumed by [name], CNM @ [address], CNM 01/18/2023, 7:05 PM   Assumed care from [name] at 21:30 PM- [name], MD  I independently reviewed the images. [place] images which showed IUP with early gestational sac in the uterus. Likely earlier pregnancy then LMP.  Agree with radiology interpretation.   9:46 PM discussed with patient results of [place]. Will need repeat US in 2  weeks for viability. No need for further BHCG.   Plan: -Discharge home in stable condition - precautions discussed -Patient advised to follow-up with  -Patient may return to MAU as needed or if her condition were to change or worsen - Scheduled  for follow up US  Future Appointments  Date Time Provider Department Center  02/02/2023 10:25 AM [facility] US2 [facility]  03/07/2023  9:40 AM Tobb, Lavona [place], DO [facility] None

## 2023-01-18 NOTE — Discharge Instructions (Signed)
Return to the MAU for bleeding, cramping/abdomina pain, severe nausea and vomiting.

## 2023-01-18 NOTE — MAU Note (Signed)
.  Linda Rivas is a 32 y.o. at [redacted]w[redacted]d here in MAU reporting: She has been noticing vaginal bleeding a moderate amount of bright red bleeding that she notices when using the bathroom - changing pad 1x per day. Bleeding started yesterday. She has some intermittent sharp/discomfort on her left lower abdomen that started today. Denies any abnormal discharge.   Has been having serial Hcg's for ectopic rule out. Was to be seen on 10/18 for repeat lab draw - had scheduling conflict.  LMP: N/A Onset of complaint: Yesterday Pain score: 7/10 Vitals:   01/18/23 1841  BP: 114/75  Pulse: 85  Resp: 16  Temp: 98 F (36.7 C)  SpO2: 99%     FHT:Not indicated Lab orders placed from triage:  N/A

## 2023-01-24 NOTE — ED Provider Notes (Signed)
[facility]    CSN: [id number] Arrival date & time: 01/07/23  0808      History   Chief Complaint Chief Complaint  Patient presents with   Possible Pregnancy    HPI Linda Rivas is a 32 y.o. female.   Patient here today for evaluation of missed period. She reports she had a positive at home pregnancy test and would like confirmation of same.   The history is provided by the patient.  Possible Pregnancy Pertinent negatives include no abdominal pain.    Past Medical History:  Diagnosis Date   Abnormal Pap smear 2010   Chlamydia    Peanut allergy    Polydactyly of fingers    Rh negative status during pregnancy    Received Rhophylac at 29 weeks.    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   Rh negative status during pregnancy    Pregnancy affected by fetal growth restriction 11/29/2012    Past Surgical History:  Procedure Laterality Date   ROOT CANAL     UMBILICAL HERNIA REPAIR N/A 08/22/2013   Procedure: HERNIA REPAIR UMBILICAL ;  Surgeon: Cherylynn Ridges, MD;  Location: [facility] OR;  Service: General;  Laterality: N/A;   WISDOM TOOTH EXTRACTION      OB History     Gravida  2   Para  1   Term  1   Preterm      AB      Living  1      SAB      IAB      Ectopic      Multiple      Live Births  1            Home Medications    Prior to Admission medications   Medication Sig Start Date End Date Taking? Authorizing Provider  Prenatal Vit-Fe Fumarate-FA (PREPLUS) 27-1  MG TABS Take 1 tablet by mouth daily. 01/18/23   [name], MD    Family History Family History  Problem Relation Age of Onset   Hypertension Mother    Lupus Mother    Hypertension Father    Prostate cancer Father    Heart attack Father     Social History Social History   Tobacco Use   Smoking status: Never   Smokeless tobacco: Never  Vaping Use   Vaping status: Never Used  Substance Use Topics   Alcohol use: Yes    Alcohol/week: 1.0 standard drink of alcohol    Types: 1 Glasses of wine per week    Comment: 3 days week/wine   Drug use: No    Types: Marijuana    Comment: past use     Allergies   Peanut oil and Peanut-containing drug products   Review of Systems Review of Systems  Constitutional:  Negative for chills and fever.  Eyes:  Negative for discharge and redness.  Gastrointestinal:  Negative for abdominal pain, nausea and vomiting.  Genitourinary:  Positive for menstrual problem. Negative for vaginal bleeding and vaginal discharge.  Physical Exam Triage Vital Signs ED Triage Vitals  Encounter Vitals Group     BP 01/07/23 0812 108/69     Systolic BP Percentile --      Diastolic BP Percentile --      Pulse Rate 01/07/23 0812 82     Resp 01/07/23 0812 16     Temp 01/07/23 0812 98.2 F (36.8 C)     Temp Source 01/07/23 0812 Oral     SpO2 01/07/23 0812 97 %     Weight --      Height --      Head Circumference --      Peak Flow --      Pain Score 01/07/23 0814 0     Pain Loc --      Pain Education --      Exclude from Growth Chart --    No data found.  Updated Vital Signs BP 108/69 (BP Location: Left Arm)   Pulse 82   Temp 98.2 F (36.8 C) (Oral)   Resp 16   LMP 12/05/2022 (Exact Date)   SpO2 97%      Physical Exam Vitals and nursing note reviewed.  Constitutional:      General: She is not in acute distress.    Appearance: Normal appearance. She is not ill-appearing.  HENT:     Head: Normocephalic and atraumatic.   Eyes:     Conjunctiva/sclera: Conjunctivae normal.  Cardiovascular:     Rate and Rhythm: Normal rate.  Pulmonary:     Effort: Pulmonary effort is normal. No respiratory distress.  Neurological:     Mental Status: She is alert.  Psychiatric:        Mood and Affect: Mood normal.        Behavior: Behavior normal.        Thought Content: Thought content normal.      UC Treatments / Results  Labs (all labs ordered are listed, but only abnormal results are displayed) Labs Reviewed  POCT URINE PREGNANCY - Abnormal; Notable for the following components:      Result Value   Preg Test, Ur Positive (*)    All other components within normal limits    EKG   Radiology No results found.  Procedures Procedures (including critical care time)  Medications Ordered in UC Medications - No data to display  Initial Impression / Assessment and Plan / UC Course  I have reviewed the triage vital signs and the nursing notes.  Pertinent labs & imaging results that were available during my care of the patient were reviewed by me and considered in my medical decision making (see chart for details).    Pregnancy test positive. Recommended follow up with OBGYN.   Final Clinical Impressions(s) / UC Diagnoses   Final diagnoses:  Missed period  Positive pregnancy test   Discharge Instructions   None    ED Prescriptions   None    PDMP not reviewed this encounter.   Tomi Bamberger, PA-C 01/24/23 1517

## 2023-02-02 ENCOUNTER — Ambulatory Visit (INDEPENDENT_AMBULATORY_CARE_PROVIDER_SITE_OTHER): Payer: Self-pay

## 2023-02-02 ENCOUNTER — Other Ambulatory Visit: Payer: Self-pay

## 2023-02-02 DIAGNOSIS — O3680X0 Pregnancy with inconclusive fetal viability, not applicable or unspecified: Secondary | ICD-10-CM

## 2023-02-02 DIAGNOSIS — Z3A00 Weeks of gestation of pregnancy not specified: Secondary | ICD-10-CM

## 2023-02-07 ENCOUNTER — Telehealth: Payer: Self-pay

## 2023-02-07 NOTE — Telephone Encounter (Addendum)
Following result note reviewed from [facility], MD:  Your ultrasound is consistent with likely having had a miscarriage. You can expect you period to return in 4-6 weeks and we are happy to see you in clinic to discuss this further. One of our nurses will call you as well. Dr Linden Dolin pt to review. Results and provider recommendation given. Pt was aware she was having a miscarriage. Pt denies any pain. Reports continued spotting. Pt does desire follow up. Has been seen previously at [facility]. Will route to their admin staff for scheduling.

## 2023-02-09 ENCOUNTER — Ambulatory Visit: Payer: Medicaid Other | Admitting: Obstetrics

## 2023-02-15 ENCOUNTER — Ambulatory Visit: Payer: Medicaid Other | Admitting: Obstetrics & Gynecology

## 2023-03-06 ENCOUNTER — Encounter (HOSPITAL_BASED_OUTPATIENT_CLINIC_OR_DEPARTMENT_OTHER): Payer: Self-pay

## 2023-03-06 ENCOUNTER — Emergency Department (HOSPITAL_BASED_OUTPATIENT_CLINIC_OR_DEPARTMENT_OTHER)
Admission: EM | Admit: 2023-03-06 | Discharge: 2023-03-06 | Disposition: A | Discharge status: Home / Self Care | Payer: Self-pay | Attending: Emergency Medicine | Admitting: Emergency Medicine

## 2023-03-06 ENCOUNTER — Other Ambulatory Visit: Payer: Self-pay

## 2023-03-06 DIAGNOSIS — B9689 Other specified bacterial agents as the cause of diseases classified elsewhere: Secondary | ICD-10-CM

## 2023-03-06 DIAGNOSIS — N760 Acute vaginitis: Secondary | ICD-10-CM | POA: Insufficient documentation

## 2023-03-06 DIAGNOSIS — N3001 Acute cystitis with hematuria: Secondary | ICD-10-CM | POA: Insufficient documentation

## 2023-03-06 DIAGNOSIS — Z202 Contact with and (suspected) exposure to infections with a predominantly sexual mode of transmission: Secondary | ICD-10-CM | POA: Insufficient documentation

## 2023-03-06 DIAGNOSIS — Z91010 Allergy to peanuts: Secondary | ICD-10-CM | POA: Insufficient documentation

## 2023-03-06 DIAGNOSIS — D72829 Elevated white blood cell count, unspecified: Secondary | ICD-10-CM | POA: Insufficient documentation

## 2023-03-06 DIAGNOSIS — Z1152 Encounter for screening for COVID-19: Secondary | ICD-10-CM | POA: Insufficient documentation

## 2023-03-06 LAB — URINALYSIS, ROUTINE W REFLEX MICROSCOPIC
Bilirubin Urine: NEGATIVE
Glucose, UA: NEGATIVE mg/dL
Ketones, ur: NEGATIVE mg/dL
Nitrite: NEGATIVE
Protein, ur: 30 mg/dL — AB
Specific Gravity, Urine: 1.01 (ref 1.005–1.030)
WBC, UA: >50 WBC/hpf (ref 0–5)
pH: 6.5 (ref 5.0–8.0)

## 2023-03-06 LAB — GROUP A STREP BY PCR: Group A Strep by PCR: NOT DETECTED

## 2023-03-06 LAB — RESP PANEL BY RT-PCR (RSV, FLU A&B, COVID)  RVPGX2
Influenza A by PCR: NEGATIVE
Influenza B by PCR: NEGATIVE
Resp Syncytial Virus by PCR: NEGATIVE
SARS Coronavirus 2 by RT PCR: NEGATIVE

## 2023-03-06 LAB — WET PREP, GENITAL
Sperm: NONE SEEN
Trich, Wet Prep: NONE SEEN
WBC, Wet Prep HPF POC: <10 (ref <10)
Yeast Wet Prep HPF POC: NONE SEEN

## 2023-03-06 LAB — PREGNANCY, URINE: Preg Test, Ur: NEGATIVE

## 2023-03-06 MED ORDER — SODIUM CHLORIDE 0.9 % IV SOLN
1.0000 g | Freq: Once | INTRAVENOUS | Status: AC
  Administered 2023-03-06: 1 g via INTRAVENOUS
  Filled 2023-03-06: qty 10

## 2023-03-06 MED ORDER — AZITHROMYCIN 250 MG PO TABS
1000.0000 mg | ORAL_TABLET | Freq: Once | ORAL | Status: AC
  Administered 2023-03-06: 1000 mg via ORAL
  Filled 2023-03-06: qty 4

## 2023-03-06 MED ORDER — CEPHALEXIN 500 MG PO CAPS: 500.0000 mg | ORAL_CAPSULE | Freq: Three times a day (TID) | ORAL | 0 refills | Status: AC

## 2023-03-06 MED ORDER — METRONIDAZOLE 500 MG PO TABS: 500.0000 mg | ORAL_TABLET | Freq: Two times a day (BID) | ORAL | 0 refills | Status: AC

## 2023-03-06 NOTE — ED Triage Notes (Signed)
Pt POV from home reporting dysuria x2 days, also reporting hoarse throat x2 weeks, denies congestion or fever.

## 2023-03-06 NOTE — Discharge Instructions (Addendum)
Labs concerning for urinary tract infection, also bacterial vaginosis. You were prescribed antibiotics to treat this. Your STI results will be available on mychart in the next 2-3 days.   Please follow up with OBGYN   It was a pleasure caring for you today in the emergency department.  Please return to the emergency department for any worsening or worrisome symptoms.

## 2023-03-06 NOTE — ED Provider Notes (Signed)
[place] [facility] DE[facility]MENT AT [facility] Provider Note  CSN: [id number] Arrival date & time: 03/06/23 1817  Chief Complaint(s) Hoarse and Dysuria  HPI Linda Rivas is a 32 y.o. female with past medical history as below, significant for recent miscarriage, peanut allergy who presents to the ED with complaint of dysuria, urgency, vaginal bleeding, increased urinary frequency, concern for UTI.  Patient had recent miscarriage, she has pending follow-up with OB/GYN.  Vaginal bleeding has improved, she is now only spotting.  Abdominal pain is minimal.  She began having some urgency and dysuria, frequency over the past 48 hours.  Concerned she has a UTI.  No flank pain, nausea vomiting or fevers.  She also would like STI testing  Past Medical History Past Medical History:  Diagnosis Date   Abnormal Pap smear 2010   Chlamydia    Peanut allergy    Polydactyly of fingers    Rh negative status during pregnancy    Received Rhophylac at 29 weeks.   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 Authorizing Provider  cephALEXin (KEFLEX) 500 MG capsule Take 1 capsule (500 mg total) by mouth 3 (three) times daily for 7 days. 03/07/23 03/14/23 Yes Tanda Rockers A, DO  metroNIDAZOLE (FLAGYL) 500 MG tablet Take 1 tablet (500 mg  total) by mouth 2 (two) times daily for 7 days. 03/06/23 03/13/23 Yes Sloan Leiter, DO  Prenatal Vit-Fe Fumarate-FA (PREPLUS) 27-1 MG TABS Take 1 tablet by mouth daily. 01/18/23   [name], MD                                                                                                                                    Past Surgical History Past Surgical History:  Procedure Laterality Date   ROOT CANAL     UMBILICAL HERNIA REPAIR N/A 08/22/2013   Procedure: HERNIA REPAIR UMBILICAL ;  Surgeon: Cherylynn Ridges, MD;  Location: [facility] OR;  Service: General;  Laterality: N/A;   WISDOM TOOTH EXTRACTION     Family History Family History  Problem Relation Age of Onset   Hypertension Mother    Lupus Mother    Hypertension Father    Prostate cancer Father    Heart attack Father     Social History Social History  Tobacco Use   Smoking status: Never   Smokeless tobacco: Never  Vaping Use   Vaping status: Never Used  Substance Use Topics   Alcohol use: Yes    Alcohol/week: 1.0 standard drink of alcohol    Types: 1 Glasses of wine per week    Comment: 3 days week/wine   Drug use: No    Types: Marijuana    Comment: past use   Allergies Peanut oil and Peanut-containing drug products  Review of Systems Review of Systems  Constitutional:  Negative for chills and fever.  Respiratory:  Negative for chest tightness and shortness of breath.   Cardiovascular:  Negative for chest pain and palpitations.  Gastrointestinal:  Negative for abdominal pain, nausea and vomiting.  Genitourinary:  Positive for dysuria, frequency, urgency and vaginal bleeding.  Musculoskeletal:  Negative for back pain.  Skin:  Negative for rash and wound.  Neurological:  Negative for light-headedness and headaches.  All other systems reviewed and are negative.   Physical Exam Vital Signs  I have reviewed the triage vital signs BP (!) 121/94   Pulse 80   Temp 98.6 F (37 C) (Oral)   Resp 18   Ht  5\' 1"  (1.549 m)   Wt 45.4 kg   LMP 12/05/2022 (Exact Date) Comment: dx miscariage 11/22  SpO2 99%   Breastfeeding Unknown   BMI 18.89 kg/m  Physical Exam Vitals and nursing note reviewed.  Constitutional:      General: She is not in acute distress.    Appearance: Normal appearance.  HENT:     Head: Normocephalic and atraumatic.     Right Ear: External ear normal.     Left Ear: External ear normal.     Nose: Nose normal.     Mouth/Throat:     Mouth: Mucous membranes are moist.  Eyes:     General: No scleral icterus.       Right eye: No discharge.        Left eye: No discharge.  Cardiovascular:     Rate and Rhythm: Normal rate and regular rhythm.     Pulses: Normal pulses.     Heart sounds: Normal heart sounds.  Pulmonary:     Effort: Pulmonary effort is normal. No respiratory distress.     Breath sounds: Normal breath sounds. No stridor.  Abdominal:     General: Abdomen is flat. There is no distension.     Palpations: Abdomen is soft.     Tenderness: There is abdominal tenderness in the suprapubic area. There is no guarding or rebound. Negative signs include Murphy's sign.       Comments: Mild   Musculoskeletal:     Cervical back: No rigidity.     Right lower leg: No edema.     Left lower leg: No edema.  Skin:    General: Skin is warm and dry.     Capillary Refill: Capillary refill takes less than 2 seconds.  Neurological:     Mental Status: She is alert.  Psychiatric:        Mood and Affect: Mood normal.        Behavior: Behavior normal. Behavior is cooperative.     ED Results and Treatments Labs (all labs ordered are listed, but only abnormal results are displayed) Labs Reviewed  WET PREP, GENITAL - Abnormal; Notable for the following components:      Result Value   Clue Cells Wet Prep HPF POC PRESENT (*)    All other  components within normal limits  URINALYSIS, ROUTINE W REFLEX MICROSCOPIC - Abnormal; Notable for the following components:   APPearance  HAZY (*)    Hgb urine dipstick MODERATE (*)    Protein, ur 30 (*)    Leukocytes,Ua LARGE (*)    Bacteria, UA RARE (*)    All other components within normal limits  GROUP A STREP BY PCR  RESP PANEL BY RT-PCR (RSV, FLU A&B, COVID)  RVPGX2  URINE CULTURE  PREGNANCY, URINE  RPR  HIV ANTIBODY (ROUTINE TESTING W REFLEX)  GC/CHLAMYDIA PROBE AMP ([place]) NOT AT [facility]                                                                                                                          Radiology No results found.  Pertinent labs & imaging results that were available during my care of the patient were reviewed by me and considered in my medical decision making (see MDM for details).  Medications Ordered in ED Medications  cefTRIAXone (ROCEPHIN) 1 g in sodium chloride 0.9 % 100 mL IVPB (0 g Intravenous Stopped 03/06/23 2240)  azithromycin (ZITHROMAX) tablet 1,000 mg (1,000 mg Oral Given 03/06/23 2314)                                                                                                                                     Procedures Procedures  (including critical care time)  Medical Decision Making / ED Course    Medical Decision Making:    Linda Rivas is a 32 y.o. female with past medical history as below, significant for recent miscarriage, peanut allergy who presents to the ED with complaint of dysuria, urgency, vaginal bleeding, increased urinary frequency, concern for UTI.. The complaint involves an extensive differential diagnosis and also carries with it a high risk of complications and morbidity.  Serious etiology was considered. Ddx includes but is not limited to: Differential diagnosis includes but is not exclusive to ectopic pregnancy, ovarian cyst, ovarian torsion, acute appendicitis, urinary tract infection, endometriosis, bowel obstruction, hernia, colitis, renal colic, gastroenteritis, volvulus etc.   Complete initial physical exam performed, notably the  patient was in no acute distress, sitting upright on stretcher, HDS no fever.    Reviewed and confirmed nursing documentation for past medical history, family history, social history.  Vital signs reviewed.    Patient recent miscarriage, bleeding has improved, no fevers,  no significant abdominal pain, no abnormal discharge.  She is pending further follow-up with OB/GYN; Ultrasound 11/6 concern for possible miscarriage  Concern for UTI, send UA.  Patient also requesting STI testing  Clinical Course as of 03/06/23 2347  Wynelle Link Mar 06, 2023  2210 Bacteria, UA(!): RARE [SG]  2210 WBC, UA: >50 Send urine culture [SG]  2251 Clue Cells Wet Prep HPF POC(!): PRESENT [SG]    Clinical Course User Index [SG] Sloan Leiter, DO    Brief summary: 32 year old female here with dysuria, urgency, concern for UTI, ongoing spotting from prior miscarriage.  Has improved.  No significant abdominal pain.  No fevers.  No vomiting.  She is also requesting STI testing.  UA concerning for UTI, send culture, start antibiotics, pyelonephritis unlikely.  She is agreeable to empiric treatment for STI, clue cells present on wet prep.  Will start Flagyl.  1x azithro here. Give keflex for home for UTI  Recommend she follow-up with OB/GYN, follow-up PCP  The patient improved significantly and was discharged in stable condition. Detailed discussions were had with the patient regarding current findings, and need for close f/u with PCP or on call doctor. The patient has been instructed to return immediately if the symptoms worsen in any way for re-evaluation. Patient verbalized understanding and is in agreement with current care plan. All questions answered prior to discharge.                  Additional history obtained: -Additional history obtained from na -External records from outside source obtained and reviewed including: Chart review including previous notes, labs, imaging, consultation notes including   Prior admission, primary care documentation, prior OB/GYN documentation, home medications Ultrasound 11/6 concern for possible miscarriage    Lab Tests: -I ordered, reviewed, and interpreted labs.   The pertinent results include:   Labs Reviewed  WET PREP, GENITAL - Abnormal; Notable for the following components:      Result Value   Clue Cells Wet Prep HPF POC PRESENT (*)    All other components within normal limits  URINALYSIS, ROUTINE W REFLEX MICROSCOPIC - Abnormal; Notable for the following components:   APPearance HAZY (*)    Hgb urine dipstick MODERATE (*)    Protein, ur 30 (*)    Leukocytes,Ua LARGE (*)    Bacteria, UA RARE (*)    All other components within normal limits  GROUP A STREP BY PCR  RESP PANEL BY RT-PCR (RSV, FLU A&B, COVID)  RVPGX2  URINE CULTURE  PREGNANCY, URINE  RPR  HIV ANTIBODY (ROUTINE TESTING W REFLEX)  GC/CHLAMYDIA PROBE AMP ([place]) NOT AT [facility]    Notable for as above  EKG   EKG Interpretation Date/Time:    Ventricular Rate:    PR Interval:    QRS Duration:    QT Interval:    QTC Calculation:   R Axis:      Text Interpretation:           Imaging Studies ordered: na   Medicines ordered and prescription drug management: Meds ordered this encounter  Medications   cefTRIAXone (ROCEPHIN) 1 g in sodium chloride 0.9 % 100 mL IVPB    Order Specific Question:   Antibiotic Indication:    Answer:   STD   azithromycin (ZITHROMAX) tablet 1,000 mg   cephALEXin (KEFLEX) 500 MG capsule    Sig: Take 1 capsule (500 mg total) by mouth 3 (three) times daily for 7 days.    Dispense:  21  capsule    Refill:  0   metroNIDAZOLE (FLAGYL) 500 MG tablet    Sig: Take 1 tablet (500 mg total) by mouth 2 (two) times daily for 7 days.    Dispense:  14 tablet    Refill:  0    -I have reviewed the patients home medicines and have made adjustments as needed   Consultations Obtained: na   Cardiac Monitoring: Continuous pulse oximetry  interpreted by myself, 99% on RA.    Social Determinants of Health:  Diagnosis or treatment significantly limited by social determinants of health: lives at home   Reevaluation: After the interventions noted above, I reevaluated the patient and found that they have improved  Co morbidities that complicate the patient evaluation  Past Medical History:  Diagnosis Date   Abnormal Pap smear 2010   Chlamydia    Peanut allergy    Polydactyly of fingers    Rh negative status during pregnancy    Received Rhophylac at 29 weeks.      Dispostion: Disposition decision including need for hospitalization was considered, and patient discharged from emergency department.    Final Clinical Impression(s) / ED Diagnoses Final diagnoses:  Acute cystitis with hematuria  BV (bacterial vaginosis)  Possible exposure to STI        Sloan Leiter, DO 03/06/23 2348

## 2023-03-07 ENCOUNTER — Ambulatory Visit: Payer: Medicaid Other | Attending: Cardiology | Admitting: Cardiology

## 2023-03-07 LAB — RPR: RPR Ser Ql: NONREACTIVE

## 2023-03-08 LAB — HIV ANTIBODY (ROUTINE TESTING W REFLEX): HIV Screen 4th Generation wRfx: NONREACTIVE

## 2023-03-08 LAB — GC/CHLAMYDIA PROBE AMP (~~LOC~~) NOT AT ARMC
Chlamydia: NEGATIVE
Comment: NEGATIVE
Comment: NORMAL
Neisseria Gonorrhea: NEGATIVE

## 2023-03-09 LAB — URINE CULTURE: Culture: >=100000 — AB

## 2023-03-10 ENCOUNTER — Telehealth (HOSPITAL_BASED_OUTPATIENT_CLINIC_OR_DEPARTMENT_OTHER): Payer: Self-pay | Admitting: *Deleted

## 2023-03-10 NOTE — Telephone Encounter (Signed)
Post ED Visit - Positive Culture Follow-up  Culture report reviewed by antimicrobial stewardship pharmacist: [facility] [x]  Daylene Posey, Pharm.D. []  Celedonio Miyamoto, Pharm.D., BCPS AQ-ID []  Garvin Fila, Pharm.D., BCPS []  Georgina Pillion, Pharm.D., BCPS []  [place], [address].D., BCPS, AAHIVP []  Estella Husk, Pharm.D., BCPS, AAHIVP []  Lysle Pearl, PharmD, BCPS []  Phillips Climes, PharmD, BCPS []  Agapito Games, PharmD, BCPS []  Verlan Friends, PharmD []  Mervyn Gay, PharmD, BCPS []  Vinnie Level, PharmD  [facility] []  Len Childs, PharmD []  Greer Pickerel, PharmD []  Adalberto Cole, PharmD []  Perlie Gold, Rph []  Lonell Face) Jean Rosenthal, PharmD []  Earl Many, PharmD []  Junita Push, PharmD []  Dorna Leitz, PharmD []  Terrilee Files, PharmD []  Lynann Beaver, PharmD []  Keturah Barre, PharmD []  Loralee Pacas, PharmD []  Bernadene Person, PharmD   Positive urine culture Treated with Cephalexin, organism sensitive to the same and no further patient follow-up is required at this time.  Virl Axe [facility] 03/10/2023, 9:05 AM

## 2023-06-11 ENCOUNTER — Ambulatory Visit
Admission: EM | Admit: 2023-06-11 | Discharge: 2023-06-11 | Disposition: A | Discharge status: Home / Self Care | Payer: Self-pay | Attending: Internal Medicine | Admitting: Internal Medicine

## 2023-06-11 DIAGNOSIS — N898 Other specified noninflammatory disorders of vagina: Secondary | ICD-10-CM | POA: Insufficient documentation

## 2023-06-11 DIAGNOSIS — Z113 Encounter for screening for infections with a predominantly sexual mode of transmission: Secondary | ICD-10-CM | POA: Insufficient documentation

## 2023-06-11 LAB — POCT URINE PREGNANCY: Preg Test, Ur: NEGATIVE

## 2023-06-11 NOTE — ED Triage Notes (Signed)
"  I am needing STI testing for STD's, No symptoms". Some vaginal discharge (? Normal). No dysuria.

## 2023-06-11 NOTE — ED Provider Notes (Signed)
 [facility]    CSN: [id number] Arrival date & time: 06/11/23  1102      History   Chief Complaint Chief Complaint  Patient presents with   SEXUALLY TRANSMITTED DISEASE    Testing    HPI Linda Rivas is a 33 y.o. female.   33 y.o. female who presents to urgent care with complaints of possible STI exposure and vaginal discharge.  She reports that she has felt that the vaginal discharge she is having is somewhat unusual and there is some mild irritation.  She has had unprotected sex and would like to have STI testing including blood work and oral swabs.  She denies a specific STI concern.  She has had a slight irritation in her throat for the last week but no other associated symptoms such as fevers, congestion, chills, cough.  She has had allergies in the past.     Past Medical History:  Diagnosis Date   Abnormal Pap smear 2010   Chlamydia    Peanut allergy    Polydactyly of fingers    Rh negative status during pregnancy    Received Rhophylac at 29 weeks.    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   Rh negative status during pregnancy    Pregnancy affected by fetal growth restriction 11/29/2012    Past Surgical History:  Procedure Laterality Date   ROOT CANAL     UMBILICAL HERNIA REPAIR N/A 08/22/2013   Procedure: HERNIA REPAIR UMBILICAL ;  Surgeon: Cherylynn Ridges, MD;  Location: [facility] OR;  Service: General;  Laterality: N/A;   WISDOM TOOTH EXTRACTION      OB History     Gravida  2   Para   1   Term  1   Preterm      AB      Living  1      SAB      IAB      Ectopic      Multiple      Live Births  1            Home Medications    Prior to Admission medications   Medication Sig Start Date End Date Taking? Authorizing Provider  Levonorgestrel-Ethinyl Estradiol (SEASONIQUE) 0.15-0.03 &0.01 MG tablet Take 1 tablet by mouth daily. 11/22/13  Yes [provider]  BIOTIN PO Take 10,000 mcg by mouth once a week.    [provider]  BIOTIN PO 1 tablet every week    [provider]  Levonorgestrel-Ethinyl Estradiol (SEASONIQUE) 0.15-0.03 &0.01 MG tablet Take 1 tablet by mouth daily.    [provider]  Prenatal Vit-Fe Fum-FA-Omega (PRENATAL MULTI +DHA PO) Take 1 tablet by mouth daily.    [provider]  Prenatal Vit-Fe Fum-FA-Omega (PRENATAL MULTI +DHA PO) Take 1 tablet by mouth daily.    [provider]  Prenatal Vit-Fe Fumarate-FA (PREPLUS) 27-1 MG TABS Take 1 tablet by mouth daily. 01/18/23   [name], MD    Family History Family History  Problem Relation Age of  Onset   Hypertension Mother    Lupus Mother    Hypertension Father    Prostate cancer Father    Heart attack Father     Social History Social History   Tobacco Use   Smoking status: Never   Smokeless tobacco: Never  Vaping Use   Vaping status: Never Used  Substance Use Topics   Alcohol use: Yes    Alcohol/week: 1.0 standard drink of alcohol    Types: 1 Glasses of wine per week    Comment: 3 days week/wine   Drug use: Not Currently    Types: Marijuana    Comment: past use     Allergies   Peanut oil and Peanut-containing drug products   Review of Systems Review of Systems  Constitutional:  Negative for chills and fever.  HENT:  Negative for ear pain and sore throat.   Eyes:  Negative for pain and visual disturbance.  Respiratory:  Negative for cough and shortness of breath.   Cardiovascular:  Negative for  chest pain and palpitations.  Gastrointestinal:  Negative for abdominal pain and vomiting.  Genitourinary:  Negative for dysuria and hematuria.       Vaginal discharge that is slightly more than normal and mild irritation  Musculoskeletal:  Negative for arthralgias and back pain.  Skin:  Negative for color change and rash.  Neurological:  Negative for seizures and syncope.  All other systems reviewed and are negative.    Physical Exam Triage Vital Signs ED Triage Vitals  Encounter Vitals Group     BP 06/11/23 1114 101/68     Systolic BP Percentile --      Diastolic BP Percentile --      Pulse Rate 06/11/23 1114 84     Resp 06/11/23 1114 16     Temp 06/11/23 1114 97.7 F (36.5 C)     Temp Source 06/11/23 1114 Oral     SpO2 06/11/23 1114 96 %     Weight 06/11/23 1111 103 lb (46.7 kg)     Height 06/11/23 1111 5\' 1"  (1.549 m)     Head Circumference --      Peak Flow --      Pain Score 06/11/23 1106 0     Pain Loc --      Pain Education --      Exclude from Growth Chart --    No data found.  Updated Vital Signs BP 101/68 (BP Location: Left Arm)   Pulse 84   Temp 97.7 F (36.5 C) (Oral)   Resp 16   Ht 5\' 1"  (1.549 m)   Wt 103 lb (46.7 kg)   LMP 05/01/2023 (Exact Date)   SpO2 96%   BMI 19.46 kg/m   Visual Acuity Right Eye Distance:   Left Eye Distance:   Bilateral Distance:    Right Eye Near:   Left Eye Near:    Bilateral Near:     Physical Exam Vitals and nursing note reviewed.  Constitutional:      General: She is not in acute distress.    Appearance: She is well-developed.  HENT:     Head: Normocephalic and atraumatic.     Right Ear: Tympanic membrane normal.     Left Ear: Tympanic membrane normal.     Mouth/Throat:     Mouth: Mucous membranes are moist.     Pharynx: Oropharynx is clear.  Eyes:     Conjunctiva/sclera: Conjunctivae normal.  Cardiovascular:     Rate and Rhythm:  Normal rate and regular rhythm.     Heart sounds: No murmur  heard. Pulmonary:     Effort: Pulmonary effort is normal. No respiratory distress.     Breath sounds: Normal breath sounds.  Abdominal:     Palpations: Abdomen is soft.     Tenderness: There is no abdominal tenderness.  Musculoskeletal:        General: No swelling.     Cervical back: Neck supple.  Skin:    General: Skin is warm and dry.     Capillary Refill: Capillary refill takes less than 2 seconds.  Neurological:     Mental Status: She is alert.  Psychiatric:        Mood and Affect: Mood normal.      UC Treatments / Results  Labs (all labs ordered are listed, but only abnormal results are displayed) Labs Reviewed  POCT URINE PREGNANCY - Normal  RPR  HIV ANTIBODY (ROUTINE TESTING W REFLEX)  CYTOLOGY, (ORAL, ANAL, URETHRAL) ANCILLARY ONLY  CERVICOVAGINAL ANCILLARY ONLY    EKG   Radiology No results found.  Procedures Procedures (including critical care time)  Medications Ordered in UC Medications - No data to display  Initial Impression / Assessment and Plan / UC Course  I have reviewed the triage vital signs and the nursing notes.  Pertinent labs & imaging results that were available during my care of the patient were reviewed by me and considered in my medical decision making (see chart for details).     Screening examination for STI - Plan: RPR, HIV Antibody (routine testing w rflx), RPR, HIV Antibody (routine testing w rflx)  Vaginal discharge   Screening swab and blood work done today and results will be available in 24-48 hours. We will contact you if we need to arrange additional treatment based on your testing. Negative results will be on your MyChart account. Abstain from sex until you receive your final results.  Use a condom for sexual encounters. If you have any worsening or changing symptoms including abnormal discharge, pelvic pain, abdominal pain, fever, nausea, or vomiting, then you should be reevaluated.    Final Clinical Impressions(s) / UC  Diagnoses   Final diagnoses:  Screening examination for STI  Vaginal discharge     Discharge Instructions      Screening swab done and blood work today and results will be available in 24-48 hours. We will contact you if we need to arrange additional treatment based on your testing. Negative results will be on your MyChart account. Abstain from sex until you receive your final results.  Use a condom for sexual encounters. If you have any worsening or changing symptoms including abnormal discharge, pelvic pain, abdominal pain, fever, nausea, or vomiting, then you should be reevaluated.      ED Prescriptions   None    PDMP not reviewed this encounter.   Landis Martins, [place] 06/11/23 1137

## 2023-06-11 NOTE — Discharge Instructions (Addendum)
 Screening swab done and blood work today and results will be available in 24-48 hours. We will contact you if we need to arrange additional treatment based on your testing. Negative results will be on your MyChart account. Abstain from sex until you receive your final results.  Use a condom for sexual encounters. If you have any worsening or changing symptoms including abnormal discharge, pelvic pain, abdominal pain, fever, nausea, or vomiting, then you should be reevaluated.

## 2023-06-12 LAB — RPR: RPR Ser Ql: NONREACTIVE

## 2023-06-12 LAB — HIV ANTIBODY (ROUTINE TESTING W REFLEX): HIV Screen 4th Generation wRfx: NONREACTIVE

## 2023-06-13 LAB — CERVICOVAGINAL ANCILLARY ONLY
Bacterial Vaginitis (gardnerella): POSITIVE — AB
Candida Glabrata: NEGATIVE
Candida Vaginitis: NEGATIVE
Chlamydia: NEGATIVE
Comment: NEGATIVE
Comment: NEGATIVE
Comment: NEGATIVE
Comment: NEGATIVE
Comment: NEGATIVE
Comment: NORMAL
Neisseria Gonorrhea: NEGATIVE
Trichomonas: NEGATIVE

## 2023-06-13 LAB — CYTOLOGY, (ORAL, ANAL, URETHRAL) ANCILLARY ONLY
Chlamydia: NEGATIVE
Comment: NEGATIVE
Comment: NORMAL
Neisseria Gonorrhea: NEGATIVE

## 2023-06-14 ENCOUNTER — Telehealth (HOSPITAL_COMMUNITY): Payer: Self-pay

## 2023-06-14 MED ORDER — METRONIDAZOLE 500 MG PO TABS
500.0000 mg | ORAL_TABLET | Freq: Two times a day (BID) | ORAL | 0 refills | Status: AC
Start: 2023-06-14 — End: 2023-06-21

## 2023-06-14 NOTE — Telephone Encounter (Signed)
 Per protocol, pt requires tx with metronidazole. Rx sent to pharmacy on file.

## 2023-07-23 ENCOUNTER — Ambulatory Visit
Admission: EM | Admit: 2023-07-23 | Discharge: 2023-07-23 | Disposition: A | Discharge status: Home / Self Care | Payer: Self-pay | Attending: Internal Medicine | Admitting: Internal Medicine

## 2023-07-23 DIAGNOSIS — Z3202 Encounter for pregnancy test, result negative: Secondary | ICD-10-CM | POA: Insufficient documentation

## 2023-07-23 DIAGNOSIS — N760 Acute vaginitis: Secondary | ICD-10-CM | POA: Insufficient documentation

## 2023-07-23 DIAGNOSIS — Z9189 Other specified personal risk factors, not elsewhere classified: Secondary | ICD-10-CM | POA: Insufficient documentation

## 2023-07-23 DIAGNOSIS — N898 Other specified noninflammatory disorders of vagina: Secondary | ICD-10-CM | POA: Insufficient documentation

## 2023-07-23 LAB — POCT URINALYSIS DIP (MANUAL ENTRY)
Bilirubin, UA: NEGATIVE
Blood, UA: NEGATIVE
Glucose, UA: NEGATIVE mg/dL
Leukocytes, UA: NEGATIVE
Nitrite, UA: NEGATIVE
Protein Ur, POC: 100 mg/dL — AB
Spec Grav, UA: >=1.03 — AB (ref 1.010–1.025)
Urobilinogen, UA: 1 U/dL
pH, UA: 6.5 (ref 5.0–8.0)

## 2023-07-23 LAB — POCT URINE PREGNANCY: Preg Test, Ur: NEGATIVE

## 2023-07-23 MED ORDER — METRONIDAZOLE 0.75 % VA GEL: 1.0000 | Freq: Every day | VAGINAL | 0 refills | Status: AC

## 2023-07-23 NOTE — ED Triage Notes (Signed)
 Pt presents for routine STD testing. Endorses some burning x1 week. Denies abnormal discharge. No known exposures.

## 2023-07-23 NOTE — Discharge Instructions (Addendum)
 Your evaluation suggests that you likely have bacterial vaginitis.  Your vaginal testing is pending and will come back in the next 2-3 days. You will receive a phone call from staff if you test positive for any other infections. We will go ahead and start treatment for BV. Take antibiotic as prescribed to treat this. If you were given flagyl pills, do not drink alcohol during or for 48 hours after finishing antibiotic course.  Follow-up with OB/GYN or return to urgent care as needed.

## 2023-07-23 NOTE — ED Provider Notes (Addendum)
 [facility]    CSN: [id number] Arrival date & time: 07/23/23  1345      History   Chief Complaint Chief Complaint  Patient presents with   SEXUALLY TRANSMITTED DISEASE    HPI Linda Rivas is a 33 y.o. female.   Linda Rivas is a 33 y.o. female presenting for chief complaint of STD screening. She has been sexually active with new female partner unprotected. She'd like to be tested for STDs today. Denies vaginal discharge, vaginal itching, and odor. Reports burning sensation to the external vaginal region without known vaginal rash. No history of HSV-2. She reports burning with urination but wonders if this could be the burning to the external vaginal region. She recently used a different condom brand and wonders if this caused her vaginal region to be come irritated as her symptoms started right after using a "magnum" condom. Denies changes in soaps or personal hygiene products. She additionally recently shaved her vaginal region with a new razor and soap. Denies known cuts to the area after shaving.  LMP May 28, 2023, unsure of chance of pregnancy. Does not use birth control. No known exposures to STDs.        Past Medical History:  Diagnosis Date   Abnormal Pap smear 2010   Chlamydia    Peanut allergy    Polydactyly of fingers    Rh negative status during pregnancy    Received Rhophylac  at 29 weeks.    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   Rh negative status during pregnancy    Pregnancy  affected by fetal growth restriction 11/29/2012    Past Surgical History:  Procedure Laterality Date   ROOT CANAL     UMBILICAL HERNIA REPAIR N/A 08/22/2013   Procedure: HERNIA REPAIR UMBILICAL ;  Surgeon: Diantha Fossa, MD;  Location: [facility] OR;  Service: General;  Laterality: N/A;   WISDOM TOOTH EXTRACTION      OB History     Gravida  2   Para  1   Term  1   Preterm      AB      Living  1      SAB      IAB      Ectopic      Multiple      Live Births  1            Home Medications    Prior to Admission medications   Medication Sig Start Date End Date Taking? Authorizing Provider  metroNIDAZOLE  (METROGEL ) 0.75 % vaginal gel Place 1 Applicatorful vaginally at bedtime for 5 days. 07/23/23 07/28/23 Yes Starlene Eaton, FNP  BIOTIN PO Take 10,000 mcg by mouth once a week.    [provider]  BIOTIN PO 1 tablet every week    [provider]  Levonorgestrel-Ethinyl Estradiol (SEASONIQUE) 0.15-0.03 &0.01 MG tablet Take 1 tablet by mouth daily.    [provider]  Levonorgestrel-Ethinyl Estradiol (SEASONIQUE) 0.15-0.03 &0.01 MG tablet Take 1 tablet by mouth daily. 11/22/13   [provider]  Prenatal Vit-Fe Fum-FA-Omega (PRENATAL MULTI +DHA PO) Take 1 tablet by mouth daily.    [provider]  Prenatal Vit-Fe Fum-FA-Omega (PRENATAL MULTI +DHA PO) Take 1 tablet by mouth daily.    [provider]  Prenatal Vit-Fe Fumarate-FA (PREPLUS) 27-1 MG TABS Take 1 tablet by mouth daily. 01/18/23   Abner Ables, MD    Family History Family History  Problem Relation Age of Onset   Hypertension Mother    Lupus Mother    Hypertension Father    Prostate cancer Father    Heart attack Father     Social History Social History   Tobacco Use   Smoking status: Never   Smokeless tobacco: Never  Vaping Use   Vaping status: Never Used  Substance Use Topics   Alcohol use: Yes    Alcohol/week: 1.0 standard drink of  alcohol    Types: 1 Glasses of wine per week    Comment: 3 days week/wine   Drug use: Not Currently    Types: Marijuana    Comment: past use     Allergies   Peanut oil and Peanut-containing drug products   Review of Systems Review of Systems Per HPI  Physical Exam Triage Vital Signs ED Triage Vitals [07/23/23 1412]  Encounter Vitals Group     BP 117/71     Systolic BP Percentile      Diastolic BP Percentile      Pulse Rate 92     Resp 18     Temp 97.9 F (36.6 C)     Temp Source Oral     SpO2 95 %     Weight      Height      Head Circumference      Peak Flow      Pain Score      Pain Loc      Pain Education      Exclude from Growth Chart    No data found.  Updated Vital Signs BP 117/71   Pulse 92   Temp 97.9 F (36.6 C) (Oral)   Resp 18   LMP 05/28/2023 (Approximate)   SpO2 95%   Visual Acuity Right Eye Distance:   Left Eye Distance:   Bilateral Distance:    Right Eye Near:   Left Eye Near:    Bilateral Near:     Physical Exam Vitals and nursing note reviewed. Exam conducted with a chaperone present Cain Castillo, RN present for entirety of GU exam).  Constitutional:      Appearance: She is not ill-appearing or toxic-appearing.  HENT:     Head: Normocephalic and atraumatic.     Right Ear: Hearing and external ear normal.     Left Ear: Hearing and external ear normal.     Nose: Nose normal.     Mouth/Throat:     Lips: Pink.  Eyes:     General: Lids are normal. Vision grossly intact. Gaze aligned appropriately.     Extraocular Movements: Extraocular movements intact.     Conjunctiva/sclera: Conjunctivae normal.  Pulmonary:     Effort: Pulmonary effort is normal.  Genitourinary:    Exam position: Knee-chest position.     Vagina: Vaginal discharge (Thin white discharge present to the vaginal introitus on exam.) present.       Comments: No signs of folliculitis.  Musculoskeletal:     Cervical back: Neck supple.  Skin:    General: Skin is warm  and dry.  Capillary Refill: Capillary refill takes less than 2 seconds.     Findings: No rash.  Neurological:     General: No focal deficit present.     Mental Status: She is alert and oriented to person, place, and time. Mental status is at baseline.     Cranial Nerves: No dysarthria or facial asymmetry.  Psychiatric:        Mood and Affect: Mood normal.        Speech: Speech normal.        Behavior: Behavior normal.        Thought Content: Thought content normal.        Judgment: Judgment normal.      UC Treatments / Results  Labs (all labs ordered are listed, but only abnormal results are displayed) Labs Reviewed  POCT URINALYSIS DIP (MANUAL ENTRY) - Abnormal; Notable for the following components:      Result Value   Ketones, POC UA small (15) (*)    Spec Grav, UA >=1.030 (*)    Protein Ur, POC =100 (*)    All other components within normal limits  HSV CULTURE AND TYPING  RPR  HIV ANTIBODY (ROUTINE TESTING W REFLEX)  POCT URINE PREGNANCY  CERVICOVAGINAL ANCILLARY ONLY    EKG   Radiology No results found.  Procedures Procedures (including critical care time)  Medications Ordered in UC Medications - No data to display  Initial Impression / Assessment and Plan / UC Course  I have reviewed the triage vital signs and the nursing notes.  Pertinent labs & imaging results that were available during my care of the patient were reviewed by me and considered in my medical decision making (see chart for details).   1. At risk for STD due to unprotected sex, vaginal irritation, urine pregnancy test negative, acute vaginitis High clinical suspicion for bacterial vaginosis, therefore will treat with MetroGel  empirically. Patient requests MetroGel  versus pills.  Vaginal swab pending, will treat for other positive infections per protocol when labs result. HIV and RPR pending. Discussed tips to prevent recurrent BV infections. Safe sex precautions discussed.  HSV culture  and typing pending, staff will call if positive. I suspect vaginal irritation is most likely secondary to recent shaving the vaginal hair versus HSV-2, however the swab is pending to be thorough.   Urine pregnancy is negative. Urinalysis is unremarkable for signs of UTI.   Counseled patient on potential for adverse effects with medications prescribed/recommended today, strict ER and return-to-clinic precautions discussed, patient verbalized understanding.    Final Clinical Impressions(s) / UC Diagnoses   Final diagnoses:  At risk for sexually transmitted disease due to unprotected sex  Vaginal irritation  Urine pregnancy test negative  Acute vaginitis     Discharge Instructions      Your evaluation suggests that you likely have bacterial vaginitis.  Your vaginal testing is pending and will come back in the next 2-3 days. You will receive a phone call from staff if you test positive for any other infections. We will go ahead and start treatment for BV. Take antibiotic as prescribed to treat this. If you were given flagyl  pills, do not drink alcohol during or for 48 hours after finishing antibiotic course.  Follow-up with OB/GYN or return to urgent care as needed.       ED Prescriptions     Medication Sig Dispense Auth. Provider   metroNIDAZOLE  (METROGEL ) 0.75 % vaginal gel Place 1 Applicatorful vaginally at bedtime for 5 days. 50 g Macari Zalesky,  Danny Dye, FNP      PDMP not reviewed this encounter.   Starlene Eaton, FNP 07/23/23 2024    Starlene Eaton, FNP 07/23/23 2025

## 2023-07-24 LAB — HIV ANTIBODY (ROUTINE TESTING W REFLEX): HIV Screen 4th Generation wRfx: NONREACTIVE

## 2023-07-24 LAB — RPR: RPR Ser Ql: NONREACTIVE

## 2023-07-25 LAB — CERVICOVAGINAL ANCILLARY ONLY
Bacterial Vaginitis (gardnerella): POSITIVE — AB
Candida Glabrata: NEGATIVE
Candida Vaginitis: POSITIVE — AB
Chlamydia: NEGATIVE
Comment: NEGATIVE
Comment: NEGATIVE
Comment: NEGATIVE
Comment: NEGATIVE
Comment: NEGATIVE
Comment: NORMAL
Neisseria Gonorrhea: NEGATIVE
Trichomonas: NEGATIVE

## 2023-07-26 ENCOUNTER — Telehealth (HOSPITAL_COMMUNITY): Payer: Self-pay

## 2023-07-26 MED ORDER — FLUCONAZOLE 150 MG PO TABS: 150.0000 mg | ORAL_TABLET | Freq: Once | ORAL | 0 refills | Status: AC

## 2023-07-27 LAB — HSV CULTURE AND TYPING

## 2023-10-31 ENCOUNTER — Emergency Department (HOSPITAL_BASED_OUTPATIENT_CLINIC_OR_DEPARTMENT_OTHER)
Admission: EM | Admit: 2023-10-31 | Discharge: 2023-10-31 | Disposition: A | Discharge status: Home / Self Care | Payer: Self-pay | Attending: Emergency Medicine | Admitting: Emergency Medicine

## 2023-10-31 ENCOUNTER — Other Ambulatory Visit: Payer: Self-pay

## 2023-10-31 DIAGNOSIS — A749 Chlamydial infection, unspecified: Secondary | ICD-10-CM | POA: Insufficient documentation

## 2023-10-31 DIAGNOSIS — Z202 Contact with and (suspected) exposure to infections with a predominantly sexual mode of transmission: Secondary | ICD-10-CM

## 2023-10-31 DIAGNOSIS — U071 COVID-19: Secondary | ICD-10-CM | POA: Insufficient documentation

## 2023-10-31 LAB — URINALYSIS, ROUTINE W REFLEX MICROSCOPIC
Bilirubin Urine: NEGATIVE
Glucose, UA: NEGATIVE mg/dL
Ketones, ur: 15 mg/dL — AB
Leukocytes,Ua: NEGATIVE
Nitrite: NEGATIVE
Protein, ur: 30 mg/dL — AB
Specific Gravity, Urine: 1.031 — ABNORMAL HIGH (ref 1.005–1.030)
pH: 6 (ref 5.0–8.0)

## 2023-10-31 LAB — RESP PANEL BY RT-PCR (RSV, FLU A&B, COVID)  RVPGX2
Influenza A by PCR: NEGATIVE
Influenza B by PCR: NEGATIVE
Resp Syncytial Virus by PCR: NEGATIVE
SARS Coronavirus 2 by RT PCR: POSITIVE — AB

## 2023-10-31 LAB — WET PREP, GENITAL
Clue Cells Wet Prep HPF POC: NONE SEEN
Sperm: NONE SEEN
Trich, Wet Prep: NONE SEEN
WBC, Wet Prep HPF POC: <10 (ref <10)
Yeast Wet Prep HPF POC: NONE SEEN

## 2023-10-31 LAB — PREGNANCY, URINE: Preg Test, Ur: NEGATIVE

## 2023-10-31 LAB — HIV ANTIBODY (ROUTINE TESTING W REFLEX): HIV Screen 4th Generation wRfx: NONREACTIVE

## 2023-10-31 MED ORDER — CEFTRIAXONE SODIUM 500 MG IJ SOLR
500.0000 mg | Freq: Once | INTRAMUSCULAR | Status: AC
Start: 2023-10-31 — End: 2023-10-31
  Administered 2023-10-31: 500 mg via INTRAMUSCULAR
  Filled 2023-10-31: qty 500

## 2023-10-31 MED ORDER — LIDOCAINE HCL (PF) 1 % IJ SOLN
INTRAMUSCULAR | Status: AC
  Administered 2023-10-31: 5 mL
  Filled 2023-10-31: qty 5

## 2023-10-31 MED ORDER — BENZONATATE 100 MG PO CAPS: 100.0000 mg | ORAL_CAPSULE | Freq: Three times a day (TID) | ORAL | 0 refills | Status: DC

## 2023-10-31 MED ORDER — METRONIDAZOLE 500 MG PO TABS
2000.0000 mg | ORAL_TABLET | Freq: Once | ORAL | Status: AC
  Administered 2023-10-31: 2000 mg via ORAL
  Filled 2023-10-31: qty 4

## 2023-10-31 MED ORDER — ONDANSETRON 4 MG PO TBDP: ORAL_TABLET | ORAL | 0 refills | Status: AC

## 2023-10-31 MED ORDER — AZITHROMYCIN 250 MG PO TABS
1000.0000 mg | ORAL_TABLET | Freq: Once | ORAL | Status: AC
  Administered 2023-10-31: 1000 mg via ORAL
  Filled 2023-10-31: qty 4

## 2023-10-31 NOTE — ED Triage Notes (Signed)
 Pt POV requesting STD screening, partner tested positive for chlamydia, pt endorses dysuria. Pt also requesting covid test due to congestion and runny nose.

## 2023-10-31 NOTE — Discharge Instructions (Addendum)
 Take tylenol  2 pills 4 times a day and motrin  4 pills 3 times a day.  Drink plenty of fluids.  Return for worsening shortness of breath, headache, confusion. Follow up with your family doctor.   Someone should call you with your results if they are positive.  The system does not always work and so it could be helpful to check MyChart.

## 2023-10-31 NOTE — ED Provider Notes (Signed)
 Linda Rivas EMERGENCY DEPARTMENT AT [facility] Provider Note   CSN: [id number] Arrival date & time: 10/31/23  1703     Patient presents with: No chief complaint on file.   Linda Rivas is a 33 y.o. female.   33 yo F with a chief complaints of concern for sexual transmitted disease.  She said that after she had a sexual encounter with her partner she felt like things were not right and asked him to get tested.  He called her today with his results and said that he was positive for chlamydia.  She has had some dysuria.  She denies any vaginal discharge.  Denies any pelvic pain.  She has had a little bit of a sore throat and congestion.  Her sexual partner also told her that he had had COVID recently.        Prior to Admission medications   Medication Sig Start Date End Date Taking? Authorizing Provider  benzonatate  (TESSALON ) 100 MG capsule Take 1 capsule (100 mg total) by mouth every 8 (eight) hours. 10/31/23  Yes Emil Share, DO  ondansetron  (ZOFRAN -ODT) 4 MG disintegrating tablet 4mg  ODT q4 hours prn nausea/vomit 10/31/23  Yes Dana Dorner, DO  BIOTIN PO Take 10,000 mcg by mouth once a week.    [provider]  BIOTIN PO 1 tablet every week    [provider]  Levonorgestrel-Ethinyl Estradiol (SEASONIQUE) 0.15-0.03 &0.01 MG tablet Take 1 tablet by mouth daily.    [provider]  Levonorgestrel-Ethinyl Estradiol (SEASONIQUE) 0.15-0.03 &0.01 MG tablet Take 1 tablet by mouth daily. 11/22/13   [provider]  Prenatal Vit-Fe Fum-FA-Omega (PRENATAL MULTI +DHA PO) Take 1 tablet by mouth daily.    [provider]  Prenatal Vit-Fe Fum-FA-Omega (PRENATAL MULTI +DHA PO) Take 1 tablet by mouth daily.    [provider]  Prenatal Vit-Fe Fumarate-FA (PREPLUS) 27-1 MG TABS Take 1 tablet by mouth daily. 01/18/23   Eldonna Suzen Octave, MD    Allergies: Peanut oil and Peanut-containing drug products    Review of Systems  Updated  Vital Signs BP 115/87   Pulse 87   Temp 98 F (36.7 C) (Temporal)   Resp 18   Ht 5' 1 (1.549 m)   Wt 45.4 kg   LMP 12/05/2022 (Exact Date)   SpO2 98%   BMI 18.89 kg/m   Physical Exam Vitals and nursing note reviewed.  Constitutional:      General: She is not in acute distress.    Appearance: She is well-developed. She is not diaphoretic.  HENT:     Head: Normocephalic and atraumatic.  Eyes:     Pupils: Pupils are equal, round, and reactive to light.  Cardiovascular:     Rate and Rhythm: Normal rate and regular rhythm.     Heart sounds: No murmur heard.    No friction rub. No gallop.  Pulmonary:     Effort: Pulmonary effort is normal.     Breath sounds: No wheezing or rales.  Abdominal:     General: There is no distension.     Palpations: Abdomen is soft.     Tenderness: There is no abdominal tenderness.  Musculoskeletal:        General: No tenderness.     Cervical back: Normal range of motion and neck supple.  Skin:    General: Skin is warm and dry.  Neurological:     Mental Status: She is alert and oriented to person, place, and time.  Psychiatric:  Behavior: Behavior normal.     (all labs ordered are listed, but only abnormal results are displayed) Labs Reviewed  RESP PANEL BY RT-PCR (RSV, FLU A&B, COVID)  RVPGX2 - Abnormal; Notable for the following components:      Result Value   SARS Coronavirus 2 by RT PCR POSITIVE (*)    All other components within normal limits  URINALYSIS, ROUTINE W REFLEX MICROSCOPIC - Abnormal; Notable for the following components:   APPearance HAZY (*)    Specific Gravity, Urine 1.031 (*)    Hgb urine dipstick SMALL (*)    Ketones, ur 15 (*)    Protein, ur 30 (*)    Bacteria, UA RARE (*)    All other components within normal limits  WET PREP, GENITAL  PREGNANCY, URINE  RPR  HIV ANTIBODY (ROUTINE TESTING W REFLEX)  GC/CHLAMYDIA PROBE AMP ([place]) NOT AT [facility]    EKG: None  Radiology: No results  found.   Procedures   Medications Ordered in the ED  cefTRIAXone  (ROCEPHIN ) injection 500 mg (500 mg Intramuscular Given 10/31/23 1908)  azithromycin  (ZITHROMAX ) tablet 1,000 mg (1,000 mg Oral Given 10/31/23 1914)  metroNIDAZOLE  (FLAGYL ) tablet 2,000 mg (2,000 mg Oral Given 10/31/23 1913)  lidocaine  (PF) (XYLOCAINE ) 1 % injection (5 mLs  Given 10/31/23 1853)                                    Medical Decision Making Amount and/or Complexity of Data Reviewed Labs: ordered.  Risk Prescription drug management.   33 yo F with a chief complaints of concern for sexually-transmitted disease.  Sexual partner called her today and said that he had tested positive for chlamydia.  She would like to be tested and treated for all STDs.  She also is worried that he has had COVID recently and she has also had cough and congestion.  Patient is COVID-positive.  Wet prep unremarkable.  Pregnancy test negative.  UA without obvious infection.  Will treat supportively.  PCP follow-up.  8:25 PM:  I have discussed the diagnosis/risks/treatment options with the patient.  Evaluation and diagnostic testing in the emergency department does not suggest an emergent condition requiring admission or immediate intervention beyond what has been performed at this time.  They will follow up with PCP. We also discussed returning to the ED immediately if new or worsening sx occur. We discussed the sx which are most concerning (e.g., sudden worsening pain, fever, inability to tolerate by mouth) that necessitate immediate return. Medications administered to the patient during their visit and any new prescriptions provided to the patient are listed below.  Medications given during this visit Medications  cefTRIAXone  (ROCEPHIN ) injection 500 mg (500 mg Intramuscular Given 10/31/23 1908)  azithromycin  (ZITHROMAX ) tablet 1,000 mg (1,000 mg Oral Given 10/31/23 1914)  metroNIDAZOLE  (FLAGYL ) tablet 2,000 mg (2,000 mg Oral Given 10/31/23  1913)  lidocaine  (PF) (XYLOCAINE ) 1 % injection (5 mLs  Given 10/31/23 1853)     The patient appears reasonably screen and/or stabilized for discharge and I doubt any other medical condition or other [facility] requiring further screening, evaluation, or treatment in the ED at this time prior to discharge.       Final diagnoses:  COVID-19 virus infection  Chlamydia contact    ED Discharge Orders          Ordered    benzonatate  (TESSALON ) 100 MG capsule  Every 8 hours  10/31/23 1952    ondansetron  (ZOFRAN -ODT) 4 MG disintegrating tablet        10/31/23 1952               Emil Share, DO 10/31/23 2025

## 2023-11-01 LAB — RPR: RPR Ser Ql: NONREACTIVE

## 2023-11-01 LAB — GC/CHLAMYDIA PROBE AMP (~~LOC~~) NOT AT ARMC
Chlamydia: NEGATIVE
Comment: NEGATIVE
Comment: NORMAL
Neisseria Gonorrhea: NEGATIVE

## 2024-01-08 ENCOUNTER — Encounter (HOSPITAL_BASED_OUTPATIENT_CLINIC_OR_DEPARTMENT_OTHER): Payer: Self-pay

## 2024-01-08 ENCOUNTER — Other Ambulatory Visit: Payer: Self-pay

## 2024-01-08 ENCOUNTER — Emergency Department (HOSPITAL_BASED_OUTPATIENT_CLINIC_OR_DEPARTMENT_OTHER)
Admission: EM | Admit: 2024-01-08 | Discharge: 2024-01-09 | Disposition: A | Attending: Emergency Medicine | Admitting: Emergency Medicine

## 2024-01-08 DIAGNOSIS — F1090 Alcohol use, unspecified, uncomplicated: Secondary | ICD-10-CM

## 2024-01-08 DIAGNOSIS — K852 Alcohol induced acute pancreatitis without necrosis or infection: Secondary | ICD-10-CM | POA: Insufficient documentation

## 2024-01-08 DIAGNOSIS — Z91010 Allergy to peanuts: Secondary | ICD-10-CM | POA: Insufficient documentation

## 2024-01-08 DIAGNOSIS — R1013 Epigastric pain: Secondary | ICD-10-CM | POA: Diagnosis present

## 2024-01-08 NOTE — ED Triage Notes (Signed)
 Pt BIB [facility] for abdominal cramping and nausea x 2 days. Pt had 2 episodes of vomiting yesterday.

## 2024-01-09 ENCOUNTER — Emergency Department (HOSPITAL_BASED_OUTPATIENT_CLINIC_OR_DEPARTMENT_OTHER)

## 2024-01-09 LAB — BASIC METABOLIC PANEL WITH GFR
Anion gap: 22 — ABNORMAL HIGH (ref 5–15)
BUN: 7 mg/dL (ref 6–20)
CO2: 14 mmol/L — ABNORMAL LOW (ref 22–32)
Calcium: 8.9 mg/dL (ref 8.9–10.3)
Chloride: 96 mmol/L — ABNORMAL LOW (ref 98–111)
Creatinine, Ser: 0.76 mg/dL (ref 0.44–1.00)
GFR, Estimated: >60 mL/min (ref >60)
Glucose, Bld: 95 mg/dL (ref 70–99)
Potassium: 4.7 mmol/L (ref 3.5–5.1)
Sodium: 131 mmol/L — ABNORMAL LOW (ref 135–145)

## 2024-01-09 LAB — COMPREHENSIVE METABOLIC PANEL WITH GFR
ALT: 54 U/L — ABNORMAL HIGH (ref 0–44)
AST: 129 U/L — ABNORMAL HIGH (ref 15–41)
Albumin: 4.9 g/dL (ref 3.5–5.0)
Alkaline Phosphatase: 70 U/L (ref 38–126)
Anion gap: 29 — ABNORMAL HIGH (ref 5–15)
BUN: 10 mg/dL (ref 6–20)
CO2: 11 mmol/L — ABNORMAL LOW (ref 22–32)
Calcium: 10.1 mg/dL (ref 8.9–10.3)
Chloride: 92 mmol/L — ABNORMAL LOW (ref 98–111)
Creatinine, Ser: 0.9 mg/dL (ref 0.44–1.00)
GFR, Estimated: >60 mL/min (ref >60)
Glucose, Bld: 105 mg/dL — ABNORMAL HIGH (ref 70–99)
Potassium: 4.6 mmol/L (ref 3.5–5.1)
Sodium: 132 mmol/L — ABNORMAL LOW (ref 135–145)
Total Bilirubin: 0.9 mg/dL (ref 0.0–1.2)
Total Protein: 8.7 g/dL — ABNORMAL HIGH (ref 6.5–8.1)

## 2024-01-09 LAB — CBC WITH DIFFERENTIAL/PLATELET
Abs Immature Granulocytes: 0.01 K/uL (ref 0.00–0.07)
Basophils Absolute: 0 K/uL (ref 0.0–0.1)
Basophils Relative: 0 %
Eosinophils Absolute: 0 K/uL (ref 0.0–0.5)
Eosinophils Relative: 0 %
HCT: 39.1 % (ref 36.0–46.0)
Hemoglobin: 13.3 g/dL (ref 12.0–15.0)
Immature Granulocytes: 0 %
Lymphocytes Relative: 10 %
Lymphs Abs: 0.5 K/uL — ABNORMAL LOW (ref 0.7–4.0)
MCH: 35.4 pg — ABNORMAL HIGH (ref 26.0–34.0)
MCHC: 34 g/dL (ref 30.0–36.0)
MCV: 104 fL — ABNORMAL HIGH (ref 80.0–100.0)
Monocytes Absolute: 0.8 K/uL (ref 0.1–1.0)
Monocytes Relative: 15 %
Neutro Abs: 3.9 K/uL (ref 1.7–7.7)
Neutrophils Relative %: 75 %
Platelets: 198 K/uL (ref 150–400)
RBC: 3.76 MIL/uL — ABNORMAL LOW (ref 3.87–5.11)
RDW: 16.7 % — ABNORMAL HIGH (ref 11.5–15.5)
WBC: 5.2 K/uL (ref 4.0–10.5)
nRBC: 0 % (ref 0.0–0.2)

## 2024-01-09 LAB — PREGNANCY, URINE: Preg Test, Ur: NEGATIVE

## 2024-01-09 LAB — URINALYSIS, ROUTINE W REFLEX MICROSCOPIC
Bacteria, UA: NONE SEEN
Bilirubin Urine: NEGATIVE
Glucose, UA: NEGATIVE mg/dL
Ketones, ur: >80 mg/dL — AB
Leukocytes,Ua: NEGATIVE
Nitrite: NEGATIVE
Protein, ur: 100 mg/dL — AB
Specific Gravity, Urine: 1.027 (ref 1.005–1.030)
pH: 6 (ref 5.0–8.0)

## 2024-01-09 LAB — LACTIC ACID, PLASMA: Lactic Acid, Venous: 0.9 mmol/L (ref 0.5–1.9)

## 2024-01-09 LAB — LIPASE, BLOOD: Lipase: 1388 U/L — ABNORMAL HIGH (ref 11–51)

## 2024-01-09 MED ORDER — LACTATED RINGERS IV BOLUS
1000.0000 mL | Freq: Once | INTRAVENOUS | Status: AC
  Administered 2024-01-09: 1000 mL via INTRAVENOUS

## 2024-01-09 MED ORDER — SODIUM CHLORIDE 0.9 % IV BOLUS
1000.0000 mL | Freq: Once | INTRAVENOUS | Status: AC
  Administered 2024-01-09: 1000 mL via INTRAVENOUS

## 2024-01-09 MED ORDER — ONDANSETRON HCL 4 MG PO TABS: 4.0000 mg | ORAL_TABLET | Freq: Three times a day (TID) | ORAL | 0 refills | Status: AC | PRN

## 2024-01-09 MED ORDER — IOHEXOL 300 MG/ML  SOLN
100.0000 mL | Freq: Once | INTRAMUSCULAR | Status: AC | PRN
  Administered 2024-01-09: 60 mL via INTRAVENOUS

## 2024-01-09 MED ORDER — HYDROCODONE-ACETAMINOPHEN 5-325 MG PO TABS: 1.0000 | ORAL_TABLET | Freq: Four times a day (QID) | ORAL | 0 refills | Status: AC | PRN

## 2024-01-09 MED ORDER — ONDANSETRON HCL 4 MG/2ML IJ SOLN: 4.0000 mg | Freq: Once | INTRAMUSCULAR | Status: DC

## 2024-01-09 MED ORDER — ONDANSETRON HCL 4 MG/2ML IJ SOLN
4.0000 mg | Freq: Once | INTRAMUSCULAR | Status: AC
  Administered 2024-01-09: 4 mg via INTRAVENOUS
  Filled 2024-01-09: qty 2

## 2024-01-09 MED ORDER — FENTANYL CITRATE (PF) 50 MCG/ML IJ SOSY
50.0000 ug | PREFILLED_SYRINGE | Freq: Once | INTRAMUSCULAR | Status: AC
  Administered 2024-01-09: 50 ug via INTRAVENOUS
  Filled 2024-01-09: qty 1

## 2024-01-09 MED ORDER — FENTANYL CITRATE (PF) 50 MCG/ML IJ SOSY
100.0000 ug | PREFILLED_SYRINGE | Freq: Once | INTRAMUSCULAR | Status: AC
  Administered 2024-01-09: 100 ug via INTRAVENOUS
  Filled 2024-01-09: qty 2

## 2024-01-09 NOTE — Discharge Instructions (Addendum)
 As we discussed, you can go to [email or web address] and find a provider to find a doctor close to you  Substance Abuse Treatment Programs  Intensive Outpatient Programs [facility]     [address]      [place], [place]                   [phone]       [facility] [address] [place], [place] [phone]  [facility]     (Inpatient and outpatient)     [address]           [phone]    [facility] [phone] (Suboxone and Methadone)  [address]      [place], [place] [address]      [phone]       [address] [place], [place] [phone]  [facility] (Outpatient/Inpatient, [facility])    (insurance only) [phone]             [facility] (Groups & Residential) [place], [place] [phone]     [facility]     [address]     [place], [place]      [phone]       [facility] (for caregivers and family) [phone] [place], [place] [phone]      Residential Treatment Programs [facility]      [address], [place], [place] [address]  [phone]       [facility] [address] [place], [place] [address] [phone]  [facility] [place]        [phone]       [facility] [phone]  [facility] ([facility]             [address]                                         [place], [place]                                                [phone] or [phone]                               [facility] [place] [address] [place], [address] [phone]  [facility]    [address]      [place], [place]     [phone]       [facility] [address] [place], [place] [phone]  [facility]   [address] [employer] [place], [place]  [address]     [phone]      Admissions: 8am-3pm M-F  [facility] ([facility]) [address] [place], [place] [phone]  [facility]: Residential Program [phone] Days)   [place], [place]      [phone] or [phone]     [facility]: [place]  [facility] [place], [place] (Walk in Hours over the weekend or by referral)  [facility] [address] [place], [place], [place] [address] [phone]  Crisis Mobile: [facility]:  [phone] (for crisis response 24 hours  a day) [facility] Hotline:      ([phone] Outpatient Psychiatry and Counseling  [facility]: Mobile Crisis Management 24 hours:  [phone]  [facility] of the [employer] sliding scale fee and walk in schedule: M-F 8am-12pm/1pm-3pm [address]  [place], [place] [address] [phone]  [facility] [address] [place], [place] [address] [phone]  [facility] ([facility] [employer])- new patient walk-in appointments available Monday - Friday 8am -3pm.          [address] [place], [place] [address] [phone] or crisis line- [phone]  [facility]/ Intensive Outpatient Therapy Program [address] [place], [place] [address] [phone]  [facility]                  Crisis Services      [phone] [address]     [place], [place] [address]                 [facility]   [facility] [phone]. [address] [place], [place] [address]   [facility]          [address] [place]  [place], [place] [address]       [phone]  [facility] [address] [place], [place] [address] [phone]  [facility]    [address]    [place], [place] [address]     [phone]       Ima Anon,  MD     [address]     [place] [address]     [phone]       [facility] [address] [place] [address]  [facility]     [address]     [place], [place]      [phone]       [facility] Rocky Lakemore, MD [address] [place], [place] [address] [phone]  [facility]     [address]     [place], [place] [address]     [phone]       [facility] [address] [place], [place] [address] [phone] Resources for Temporary Residential Assistance/Crisis Centers  [facility]) M-F 8am-3pm   [address]  [place], [place] [address]   [phone] Services include: laundry, barbering, support groups, case management, phone  & computer access, showers, AA/NA mtgs, mental health/substance abuse nurse, job skills class, disability information, VA assistance, spiritual classes, etc.   HOMELESS SHELTERS  [facility]     [facility]   [address], [place]     [phone]              [employer] (women and children)       [address] [place], [place] [address] [phone] [email or web address] for application and process Application Required  [facility]   [address]    [place] [address]     [phone]                    [facility] [address] [place], [place] [address] [phone] application appt.) Application Required  [facility] (women only)    [address]     [place], [place] [address]     [phone]      Intake starts 6pm daily Need valid ID, SSC, & Police report [employer] [address] [place], [place] [phone] Application Required  [employer] (men only)     [address]      [place],  [place]     [phone]       [facility] [place] (Pregnant women only) [address] [place], [place] [phone]  [facility]      [address]      [place], [place] [address]     [phone]             [facility] [address] [place], [place] [phone] 90 day commitment/SA/Application process  [facility](men only)     [address]     [place], [place]     [phone]       Check-in at [facility] [address] [place], [place] [address] [phone] Men/Women/Women and Children must be there by 7 pm  [facility] [place], [place] [phone]                 SEEK IMMEDIATE MEDICAL ATTENTION IF: The pain does not go away or becomes severe, particularly over the next 8-12 hours.  A temperature above 100.58F develops.  Repeated vomiting occurs (multiple episodes).  Blood is being passed in stools or vomit (bright red or black tarry stools).  Return also if you develop chest pain, difficulty breathing, dizziness or fainting, or become confused, poorly responsive, or inconsolable.

## 2024-01-09 NOTE — ED Provider Notes (Signed)
 [place] EMERGENCY DEPARTMENT AT [facility] Provider Note   CSN: [id number] Arrival date & time: 01/08/24  2342     Patient presents with: Abdominal Pain   Linda Rivas is a 33 y.o. female.   The history is provided by the patient.   Patient is otherwise healthy at baseline.  She reports that she went out to drink alcohol in the evening on Friday, October 10 She woke up the following morning with abdominal pain and nausea vomiting with nonbloody emesis.  No diarrhea.  She has been having bowel movements.  No blood in her stool.  She reports since that time the pain and vomiting are worsening, most the pain is focused in her epigastric region.  She reports distant history of umbilical hernia repair.   Past Medical History:  Diagnosis Date   Abnormal Pap smear 2010   Chlamydia    Peanut allergy    Polydactyly of fingers    Rh negative status during pregnancy    Received Rhophylac  at 29 weeks.    Prior to Admission medications   Medication Sig Start Date End Date Taking? Authorizing Provider  HYDROcodone -acetaminophen  (NORCO/VICODIN) 5-325 MG tablet Take 1 tablet by mouth every 6 (six) hours as needed for severe pain (pain score 7-10). 01/09/24  Yes Midge Golas, MD  ondansetron  (ZOFRAN ) 4 MG tablet Take 1 tablet (4 mg total) by mouth every 8 (eight) hours as needed for nausea or vomiting. 01/09/24  Yes Midge Golas, MD  BIOTIN PO Take 10,000 mcg by mouth once a week.    [provider]  BIOTIN PO 1 tablet every week    [provider]  Levonorgestrel-Ethinyl Estradiol (SEASONIQUE) 0.15-0.03 &0.01 MG tablet Take 1 tablet by mouth daily.    [provider]  Levonorgestrel-Ethinyl Estradiol (SEASONIQUE) 0.15-0.03 &0.01 MG tablet Take 1 tablet by mouth daily. 11/22/13   [provider]  ondansetron  (ZOFRAN -ODT) 4 MG disintegrating tablet 4mg  ODT q4 hours prn nausea/vomit 10/31/23   Floyd, Dan, DO  Prenatal Vit-Fe Fum-FA-Omega  (PRENATAL MULTI +DHA PO) Take 1 tablet by mouth daily.    [provider]  Prenatal Vit-Fe Fum-FA-Omega (PRENATAL MULTI +DHA PO) Take 1 tablet by mouth daily.    [provider]  Prenatal Vit-Fe Fumarate-FA (PREPLUS) 27-1 MG TABS Take 1 tablet by mouth daily. 01/18/23   Eldonna Suzen Octave, MD    Allergies: Peanut oil and Peanut-containing drug products    Review of Systems  Constitutional:  Positive for fatigue. Negative for fever.  Cardiovascular:  Negative for chest pain.  Gastrointestinal:  Positive for nausea and vomiting. Negative for blood in stool and diarrhea.  Genitourinary:  Negative for dysuria, vaginal bleeding and vaginal discharge.    Updated Vital Signs BP 117/83 (BP Location: Left Arm)   Pulse 95   Temp 98 F (36.7 C) (Oral)   Resp 14   LMP 12/02/2023 (Approximate)   SpO2 100%   Physical Exam CONSTITUTIONAL: Thin appearing no acute distress HEAD: Normocephalic/atraumatic EYES: EOMI/PERRL, no icterus ENMT: Mucous membranes dry NECK: supple no meningeal signs CV: S1/S2 noted, no murmurs/rubs/gallops noted LUNGS: Lungs are clear to auscultation bilaterally, no apparent distress ABDOMEN: soft, moderate epigastric tenderness, no rebound or guarding, bowel sounds noted throughout abdomen No abdominal wall hernias noted GU:no cva tenderness NEURO: Pt is awake/alert/appropriate, moves all extremitiesx4.  No facial droop.   EXTREMITIES: pulses normal/equal, full ROM SKIN: warm, color normal   (all labs ordered are listed, but only abnormal results are displayed) Labs  Reviewed  URINALYSIS, ROUTINE W REFLEX MICROSCOPIC - Abnormal; Notable for the following components:      Result Value   Hgb urine dipstick SMALL (*)    Ketones, ur >80 (*)    Protein, ur 100 (*)    All other components within normal limits  CBC WITH DIFFERENTIAL/PLATELET - Abnormal; Notable for the following components:   RBC 3.76 (*)    MCV 104.0 (*)    MCH 35.4 (*)    RDW  16.7 (*)    Lymphs Abs 0.5 (*)    All other components within normal limits  LIPASE, BLOOD - Abnormal; Notable for the following components:   Lipase 1,388 (*)    All other components within normal limits  COMPREHENSIVE METABOLIC PANEL WITH GFR - Abnormal; Notable for the following components:   Sodium 132 (*)    Chloride 92 (*)    CO2 11 (*)    Glucose, Bld 105 (*)    Total Protein 8.7 (*)    AST 129 (*)    ALT 54 (*)    Anion gap 29 (*)    All other components within normal limits  BASIC METABOLIC PANEL WITH GFR - Abnormal; Notable for the following components:   Sodium 131 (*)    Chloride 96 (*)    CO2 14 (*)    Anion gap 22 (*)    All other components within normal limits  PREGNANCY, URINE  LACTIC ACID, PLASMA    EKG: None  Radiology: CT ABDOMEN PELVIS W CONTRAST Result Date: 01/09/2024 CLINICAL DATA:  Abdominal pain and nausea for 2 days with elevated lipase, initial encounter EXAM: CT ABDOMEN AND PELVIS WITH CONTRAST TECHNIQUE: Multidetector CT imaging of the abdomen and pelvis was performed using the standard protocol following bolus administration of intravenous contrast. RADIATION DOSE REDUCTION: This exam was performed according to the departmental dose-optimization program which includes automated exposure control, adjustment of the mA and/or kV according to patient size and/or use of iterative reconstruction technique. CONTRAST:  60mL OMNIPAQUE IOHEXOL 300 MG/ML  SOLN COMPARISON:  None Available. FINDINGS: Lower chest: No acute abnormality. Hepatobiliary: Fatty infiltration of the liver is noted. The gallbladder is within normal limits. Pancreas: Pancreas is well visualized within normal enhancement pattern. Peripancreatic inflammatory changes are seen consistent with acute pancreatitis. Spleen: Normal in size without focal abnormality. Adrenals/Urinary Tract: Adrenal glands are within normal limits. Kidneys are well visualize within normal enhancement pattern. No renal  calculi are seen. The bladder is decompressed. Stomach/Bowel: The appendix is within normal limits. No obstructive or inflammatory changes of colon are seen. Stomach and small bowel are within normal limits. Vascular/Lymphatic: No significant vascular findings are present. No enlarged abdominal or pelvic lymph nodes. Reproductive: Uterus and bilateral adnexa are unremarkable. Other: No abdominal wall hernia or abnormality. No abdominopelvic ascites. Musculoskeletal: No acute or significant osseous findings. IMPRESSION: Changes consistent with acute pancreatitis with peripancreatic inflammatory change. No pseudocyst is noted. No pancreatic necrosis is seen at this time. Electronically Signed   By: Oneil Devonshire M.D.   On: 01/09/2024 01:55     .Critical Care  Performed by: Midge Golas, MD Authorized by: Midge Golas, MD   Critical care provider statement:    Critical care time (minutes):  80   Critical care start time:  01/09/2024 1:30 AM   Critical care end time:  01/09/2024 2:50 AM   Critical care time was exclusive of:  Separately billable procedures and treating other patients   Critical care was necessary to  treat or prevent imminent or life-threatening deterioration of the following conditions:  Sepsis, shock, metabolic crisis, hepatic failure and dehydration   Critical care was time spent personally by me on the following activities:  Obtaining history from patient or surrogate, examination of patient, pulse oximetry, ordering and review of radiographic studies, ordering and review of laboratory studies, ordering and performing treatments and interventions, re-evaluation of patient's condition, review of old charts, development of treatment plan with patient or surrogate and evaluation of patient's response to treatment   I assumed direction of critical care for this patient from another provider in my specialty: no      Medications Ordered in the ED  ondansetron  (ZOFRAN ) injection 4  mg (4 mg Intravenous Given 01/09/24 0040)  fentaNYL  (SUBLIMAZE ) injection 50 mcg (50 mcg Intravenous Given 01/09/24 0041)  sodium chloride  0.9 % bolus 1,000 mL (0 mLs Intravenous Stopped 01/09/24 0152)  fentaNYL  (SUBLIMAZE ) injection 100 mcg (100 mcg Intravenous Given 01/09/24 0131)  lactated ringers  bolus 1,000 mL (0 mLs Intravenous Stopped 01/09/24 0252)  iohexol (OMNIPAQUE) 300 MG/ML solution 100 mL (60 mLs Intravenous Contrast Given 01/09/24 0148)    Clinical Course as of 01/09/24 0429  Mon Jan 09, 2024  0031 Ketones, ur(!): >80 Ketonuria consistent with dehydration [DW]  0123 Lipase(!): 1,388 Acute pancreatitis [DW]  0123 CO2(!): 11 Anion gap metabolic acidosis noted [DW]  9875 Patient admits to drinking up to 5 days a week.  This is been ongoing for several years. She reports she has never really sought therapy for these issues.  She denies any history of alcohol withdrawal syndrome [DW]  0124 Labs are consistent with acute pancreatitis as well as a metabolic acidosis.  However her vital signs are appropriate and she is not actively vomiting.  Will continue IV fluids, check lactic acid, check CT abdomen pelvis [DW]  0302 CT imaging confirms acute pancreatitis but there is no complicating features.  Her lactic acid is negative.  Patient reports she is feeling improved and she is not actively vomiting [DW]  0302 We discussed admission versus discharge home.  Patient wishes to be discharged home Will start p.o. challenge [DW]  0303 Pt reports it has been >48 hrs since last ETOH drink and she is not having withdrawal symptoms.   [DW]  [phone] Patient continues to improve.  She has been drinking fluids without vomiting.  Repeat electrolytes reveal improved anion gap.  Her lactate was negative. Patient is still requesting discharge home [DW]  [phone] I have given her dietary recommendations for acute pancreatitis.  We discussed the need to cut back on alcohol use. Also gave her information on  finding a primary doctor as well as outpatient resources for alcohol use disorder   We discussed strict return precautions and they were placed in her discharge paperwork [DW]    Clinical Course User Index [DW] Midge Golas, MD                                 Medical Decision Making Amount and/or Complexity of Data Reviewed Labs: ordered. Decision-making details documented in ED Course. Radiology: ordered.  Risk Prescription drug management.   This patient presents to the ED for concern of abdominal pain, this involves an extensive number of treatment options, and is a complaint that carries with it a high risk of complications and morbidity.  The differential diagnosis includes but is not limited to cholecystitis, cholelithiasis, pancreatitis, gastritis, peptic ulcer  disease, appendicitis, bowel obstruction, bowel perforation, diverticulitis, ectopic pregnancy, TOA, PID, ovarian torsion, pyelonephritis    Social Determinants of Health: Patient's recent increased alcohol use  increases the complexity of managing their presentation  Additional history obtained: Records reviewed Care Everywhere/External Records  Lab Tests: I Ordered, and personally interpreted labs.  The pertinent results include: Ketonuria, dehydration  Imaging Studies ordered: I ordered imaging studies including CT scan abdomen/pelvis  I independently visualized and interpreted imaging which showed pancreatitis I agree with the radiologist interpretation  Medicines ordered and prescription drug management: I ordered medication including fentanyl  for pain Reevaluation of the patient after these medicines showed that the patient    improved  Critical Interventions:  IV fluids   Reevaluation: After the interventions noted above, I reevaluated the patient and found that they have :improved  Complexity of problems addressed: Patient's presentation is most consistent with  acute presentation with  potential threat to life or bodily function  Disposition: After consideration of the diagnostic results and the patient's response to treatment,  I feel that the patent would benefit from discharge  .        Final diagnoses:  Alcohol use disorder  Alcohol-induced acute pancreatitis without infection or necrosis    ED Discharge Orders          Ordered    ondansetron  (ZOFRAN ) 4 MG tablet  Every 8 hours PRN        01/09/24 0249    HYDROcodone -acetaminophen  (NORCO/VICODIN) 5-325 MG tablet  Every 6 hours PRN        01/09/24 0249               Midge Golas, MD 01/09/24 0430

## 2024-01-13 ENCOUNTER — Ambulatory Visit: Admitting: Family Medicine

## 2024-01-14 ENCOUNTER — Inpatient Hospital Stay (HOSPITAL_COMMUNITY)
Admission: AD | Admit: 2024-01-14 | Discharge: 2024-01-14 | Disposition: A | Discharge status: Home / Self Care | Attending: Obstetrics & Gynecology | Admitting: Obstetrics & Gynecology

## 2024-01-14 ENCOUNTER — Encounter (HOSPITAL_COMMUNITY): Payer: Self-pay | Admitting: Obstetrics & Gynecology

## 2024-01-14 DIAGNOSIS — A599 Trichomoniasis, unspecified: Secondary | ICD-10-CM | POA: Diagnosis not present

## 2024-01-14 DIAGNOSIS — A5901 Trichomonal vulvovaginitis: Secondary | ICD-10-CM

## 2024-01-14 DIAGNOSIS — R1030 Lower abdominal pain, unspecified: Secondary | ICD-10-CM | POA: Insufficient documentation

## 2024-01-14 DIAGNOSIS — Z3A00 Weeks of gestation of pregnancy not specified: Secondary | ICD-10-CM | POA: Diagnosis not present

## 2024-01-14 DIAGNOSIS — N939 Abnormal uterine and vaginal bleeding, unspecified: Secondary | ICD-10-CM | POA: Insufficient documentation

## 2024-01-14 DIAGNOSIS — Z3202 Encounter for pregnancy test, result negative: Secondary | ICD-10-CM | POA: Diagnosis present

## 2024-01-14 DIAGNOSIS — O26891 Other specified pregnancy related conditions, first trimester: Secondary | ICD-10-CM | POA: Diagnosis not present

## 2024-01-14 LAB — URINALYSIS, ROUTINE W REFLEX MICROSCOPIC
Bilirubin Urine: NEGATIVE
Glucose, UA: NEGATIVE mg/dL
Ketones, ur: NEGATIVE mg/dL
Nitrite: NEGATIVE
Protein, ur: 30 mg/dL — AB
Specific Gravity, Urine: 1.019 (ref 1.005–1.030)
pH: 5 (ref 5.0–8.0)

## 2024-01-14 LAB — WET PREP, GENITAL
Clue Cells Wet Prep HPF POC: NONE SEEN
Sperm: NONE SEEN
WBC, Wet Prep HPF POC: >=10 — AB (ref <10)
Yeast Wet Prep HPF POC: NONE SEEN

## 2024-01-14 LAB — POCT PREGNANCY, URINE: Preg Test, Ur: NEGATIVE

## 2024-01-14 LAB — HCG, QUANTITATIVE, PREGNANCY: hCG, Beta Chain, Quant, S: <1 m[IU]/mL (ref <5)

## 2024-01-14 MED ORDER — METRONIDAZOLE 500 MG PO TABS: 500.0000 mg | ORAL_TABLET | Freq: Two times a day (BID) | ORAL | 0 refills | Status: DC

## 2024-01-14 NOTE — MAU Note (Signed)
 Linda Rivas is a 33 y.o. at Unknown here in MAU reporting: started bleeding after intercourse last Sat.  Was having some cramping,  thought it was her cycle.   Was seen at [facility], neg test then.  Was dx with Pancreatitis, given meds for pain and nausea. Still having some bleeding, small red and blotchy today .  +HPT this morning.  LMP: ? Knows she had it in Sept, had some bleeding early Oct, but not like normal flow. Has been having irreg cycles.  Cramping in abd, but not like period cramps, more like muscle spasms, both sides. Lower back. Whole trunk is hurting.  Been using heat pad, taking Ibuprofen . Can't lay down or sleep Is able to eat now.   Onset of complaint: ongoing Pain score: severe Vitals:   01/14/24 0804  BP: 112/78  Pulse: 89  Resp: 16  Temp: 98.5 F (36.9 C)  SpO2: 100%      Lab orders placed from triage:  UPT, UA if +

## 2024-01-14 NOTE — MAU Provider Note (Signed)
 S:   33 y.o. [id number] who presents to MAU for abdominal cramping & vaginal bleeding. She reports positive pregnancy test this morning. States she went to [facility] ED last week for abd pain & was diagnosed with pancreatitis. States that pain has improved but now has lower abdominal pain & post coital bleeding. Denies vomiting, diarrhea, dysuria, or fever.   O: BP 112/78 (BP Location: Right Arm)   Pulse 89   Temp 98.5 F (36.9 C) (Oral)   Resp 16   Ht 5' 1 (1.549 m)   Wt 42.7 kg   LMP 12/02/2023 (Approximate) Comment: not sure about Oct  SpO2 100%   BMI 17.78 kg/m  Physical Examination: General appearance - alert, well appearing, and in no distress, oriented to person, place, and time and acyanotic, in no respiratory distress  Results for orders placed or performed during the hospital encounter of 01/14/24 (from the past 48 hours)  Pregnancy, urine POC     Status: None   Collection Time: 01/14/24  8:13 AM  Result Value Ref Range   Preg Test, Ur NEGATIVE NEGATIVE    Comment:        THE SENSITIVITY OF THIS METHODOLOGY IS >20 mIU/mL.   Urinalysis, Routine w reflex microscopic -Urine, Clean Catch     Status: Abnormal   Collection Time: 01/14/24  8:35 AM  Result Value Ref Range   Color, Urine YELLOW YELLOW   APPearance HAZY (A) CLEAR   Specific Gravity, Urine 1.019 1.005 - 1.030   pH 5.0 5.0 - 8.0   Glucose, UA NEGATIVE NEGATIVE mg/dL   Hgb urine dipstick LARGE (A) NEGATIVE   Bilirubin Urine NEGATIVE NEGATIVE   Ketones, ur NEGATIVE NEGATIVE mg/dL   Protein, ur 30 (A) NEGATIVE mg/dL   Nitrite NEGATIVE NEGATIVE   Leukocytes,Ua MODERATE (A) NEGATIVE   RBC / HPF 0-5 0 - 5 RBC/hpf   WBC, UA 6-10 0 - 5 WBC/hpf   Bacteria, UA RARE (A) NONE SEEN   Squamous Epithelial / HPF 6-10 0 - 5 /HPF   Mucus PRESENT    Hyaline Casts, UA PRESENT     Comment: Performed at [facility], [address], [place], [place] [address]  Wet prep, genital     Status: Abnormal   Collection Time:  01/14/24  8:35 AM   Specimen: Urine, Clean Catch  Result Value Ref Range   Yeast Wet Prep HPF POC NONE SEEN NONE SEEN   Trich, Wet Prep PRESENT (A) NONE SEEN   Clue Cells Wet Prep HPF POC NONE SEEN NONE SEEN   WBC, Wet Prep HPF POC >=10 (A) <10   Sperm NONE SEEN     Comment: Performed at [facility], [address], [place], [place] [address]  hCG, quantitative, pregnancy     Status: None   Collection Time: 01/14/24  8:42 AM  Result Value Ref Range   hCG, Beta Chain, Quant, S <1 <5 mIU/mL    Comment:          GEST. AGE      CONC.  (mIU/mL)   <=1 WEEK        5 - 50     2 WEEKS       50 - 500     3 WEEKS       100 - 10,000     4 WEEKS     1,000 - 30,000     5 WEEKS     3,500 - 115,000  6-8 WEEKS     12,000 - 270,000    12 WEEKS     15,000 - 220,000        FEMALE AND NON-PREGNANT FEMALE:     LESS THAN 5 mIU/mL Performed at [facility], [address], [place], [place] [address]     A/P: 1. Trichomoniasis of vagina  -UPT negative here. Vaginal swabs collected while HCG pending. Wet prep positive for trichomonas. GC/CT pending -Rx flagyl  -Partner should seek tx. No intercourse x 2 weeks after treatment.  -Infection likely causing irregular bleeding & lower abdominal pain. However, if symptoms persist beyond treatment patient should schedule f/u with [facility]  2. Negative pregnancy test  -UPT & HCG negative today -Knows to return to ED for worsening upper abdominal pain or concerns regarding pancreatitis.      Rocky Satterfield, NP 12:10 PM

## 2024-01-16 LAB — GC/CHLAMYDIA PROBE AMP (~~LOC~~) NOT AT ARMC
Chlamydia: NEGATIVE
Comment: NEGATIVE
Comment: NORMAL
Neisseria Gonorrhea: NEGATIVE

## 2024-01-30 ENCOUNTER — Ambulatory Visit: Admitting: Sports Medicine

## 2024-02-08 ENCOUNTER — Ambulatory Visit: Admitting: Family Medicine

## 2024-02-09 ENCOUNTER — Ambulatory Visit: Admitting: Family Medicine

## 2024-02-27 ENCOUNTER — Ambulatory Visit: Admitting: Family Medicine

## 2024-04-01 ENCOUNTER — Emergency Department (HOSPITAL_BASED_OUTPATIENT_CLINIC_OR_DEPARTMENT_OTHER)
Admission: EM | Admit: 2024-04-01 | Discharge: 2024-04-01 | Disposition: A | Discharge status: Home / Self Care | Attending: Emergency Medicine | Admitting: Emergency Medicine

## 2024-04-01 ENCOUNTER — Encounter (HOSPITAL_BASED_OUTPATIENT_CLINIC_OR_DEPARTMENT_OTHER): Payer: Self-pay

## 2024-04-01 ENCOUNTER — Other Ambulatory Visit: Payer: Self-pay

## 2024-04-01 DIAGNOSIS — N760 Acute vaginitis: Secondary | ICD-10-CM | POA: Diagnosis not present

## 2024-04-01 DIAGNOSIS — N390 Urinary tract infection, site not specified: Secondary | ICD-10-CM | POA: Insufficient documentation

## 2024-04-01 DIAGNOSIS — Z91010 Allergy to peanuts: Secondary | ICD-10-CM | POA: Diagnosis not present

## 2024-04-01 DIAGNOSIS — L292 Pruritus vulvae: Secondary | ICD-10-CM | POA: Diagnosis present

## 2024-04-01 DIAGNOSIS — B9689 Other specified bacterial agents as the cause of diseases classified elsewhere: Secondary | ICD-10-CM

## 2024-04-01 LAB — CBC WITH DIFFERENTIAL/PLATELET
Abs Immature Granulocytes: 0.01 K/uL (ref 0.00–0.07)
Basophils Absolute: 0 K/uL (ref 0.0–0.1)
Basophils Relative: 1 %
Eosinophils Absolute: 0 K/uL (ref 0.0–0.5)
Eosinophils Relative: 1 %
HCT: 31 % — ABNORMAL LOW (ref 36.0–46.0)
Hemoglobin: 10.8 g/dL — ABNORMAL LOW (ref 12.0–15.0)
Immature Granulocytes: 0 %
Lymphocytes Relative: 38 %
Lymphs Abs: 1.6 K/uL (ref 0.7–4.0)
MCH: 35.5 pg — ABNORMAL HIGH (ref 26.0–34.0)
MCHC: 34.8 g/dL (ref 30.0–36.0)
MCV: 102 fL — ABNORMAL HIGH (ref 80.0–100.0)
Monocytes Absolute: 0.3 K/uL (ref 0.1–1.0)
Monocytes Relative: 7 %
Neutro Abs: 2.3 K/uL (ref 1.7–7.7)
Neutrophils Relative %: 53 %
Platelets: 311 K/uL (ref 150–400)
RBC: 3.04 MIL/uL — ABNORMAL LOW (ref 3.87–5.11)
RDW: 22.7 % — ABNORMAL HIGH (ref 11.5–15.5)
WBC: 4.3 K/uL (ref 4.0–10.5)
nRBC: 0 % (ref 0.0–0.2)

## 2024-04-01 LAB — URINALYSIS, ROUTINE W REFLEX MICROSCOPIC
Bilirubin Urine: NEGATIVE
Glucose, UA: NEGATIVE mg/dL
Ketones, ur: NEGATIVE mg/dL
Nitrite: POSITIVE — AB
Protein, ur: NEGATIVE mg/dL
Specific Gravity, Urine: <1.005 — ABNORMAL LOW (ref 1.005–1.030)
pH: 6.5 (ref 5.0–8.0)

## 2024-04-01 LAB — COMPREHENSIVE METABOLIC PANEL WITH GFR
ALT: 14 U/L (ref 0–44)
AST: 83 U/L — ABNORMAL HIGH (ref 15–41)
Albumin: 5 g/dL (ref 3.5–5.0)
Alkaline Phosphatase: 59 U/L (ref 38–126)
Anion gap: 15 (ref 5–15)
BUN: <5 mg/dL — ABNORMAL LOW (ref 6–20)
CO2: 25 mmol/L (ref 22–32)
Calcium: 9.9 mg/dL (ref 8.9–10.3)
Chloride: 97 mmol/L — ABNORMAL LOW (ref 98–111)
Creatinine, Ser: 0.66 mg/dL (ref 0.44–1.00)
GFR, Estimated: >60 mL/min
Glucose, Bld: 96 mg/dL (ref 70–99)
Potassium: 3.5 mmol/L (ref 3.5–5.1)
Sodium: 137 mmol/L (ref 135–145)
Total Bilirubin: 0.7 mg/dL (ref 0.0–1.2)
Total Protein: 8.8 g/dL — ABNORMAL HIGH (ref 6.5–8.1)

## 2024-04-01 LAB — URINALYSIS, MICROSCOPIC (REFLEX)
RBC / HPF: >50 RBC/hpf (ref 0–5)
WBC, UA: >50 WBC/hpf (ref 0–5)

## 2024-04-01 LAB — WET PREP, GENITAL
Sperm: NONE SEEN
Trich, Wet Prep: NONE SEEN
WBC, Wet Prep HPF POC: <10
Yeast Wet Prep HPF POC: NONE SEEN

## 2024-04-01 LAB — PREGNANCY, URINE: Preg Test, Ur: NEGATIVE

## 2024-04-01 LAB — HIV ANTIBODY (ROUTINE TESTING W REFLEX): HIV Screen 4th Generation wRfx: NONREACTIVE

## 2024-04-01 MED ORDER — CEPHALEXIN 500 MG PO CAPS: 500.0000 mg | ORAL_CAPSULE | Freq: Two times a day (BID) | ORAL | 0 refills | Status: AC

## 2024-04-01 MED ORDER — METRONIDAZOLE 500 MG PO TABS: 500.0000 mg | ORAL_TABLET | Freq: Two times a day (BID) | ORAL | 0 refills | Status: AC

## 2024-04-01 NOTE — Discharge Instructions (Addendum)
 Your lab results today indicate that you have bacterial vaginosis and a urinary tract infection.  Start Flagyl  and take 1 tablet by mouth twice daily for 7 days for treatment of bacterial vaginosis.  Start Keflex  and take 1 tablet by mouth twice daily for 7 days for treatment of urinary tract infection.  Return to the emergency department if your symptoms worsen.  Your remaining test results will be available on MyChart in the next 24 to 48 hours.
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
## 2024-04-02 LAB — GC/CHLAMYDIA PROBE AMP (~~LOC~~) NOT AT ARMC
Chlamydia: NEGATIVE
Comment: NEGATIVE
Comment: NORMAL
Neisseria Gonorrhea: NEGATIVE

## 2024-04-02 LAB — SYPHILIS: RPR W/REFLEX TO RPR TITER AND TREPONEMAL ANTIBODIES, TRADITIONAL SCREENING AND DIAGNOSIS ALGORITHM: RPR Ser Ql: NONREACTIVE

## 2024-04-03 LAB — URINE CULTURE: Culture: 50000 — AB

## 2024-04-04 ENCOUNTER — Telehealth (HOSPITAL_BASED_OUTPATIENT_CLINIC_OR_DEPARTMENT_OTHER): Payer: Self-pay | Admitting: *Deleted

## 2024-04-04 NOTE — Telephone Encounter (Signed)
 Post ED Visit - Positive Culture Follow-up  Culture report reviewed by antimicrobial stewardship pharmacist: [facility] [x]  Elma Fail, Pharm.D. []  Venetia Gully, Pharm.D., BCPS AQ-ID []  Garrel Crews, Pharm.D., BCPS []  Almarie Lunger, Pharm.D., BCPS []  [place], [address].D., BCPS, AAHIVP []  Rosaline Bihari, Pharm.D., BCPS, AAHIVP []  Vernell Meier, PharmD, BCPS []  Latanya Hint, PharmD, BCPS []  Donald Medley, PharmD, BCPS []  Rocky Bold, PharmD []  Dorothyann Alert, PharmD, BCPS []  Morene Babe, PharmD  [facility] []  Rosaline Edison, PharmD []  Romona Bliss, PharmD []  Dolphus Roller, PharmD []  Veva Seip, Rph []  Vernell Daunt) Leonce, PharmD []  Eva Allis, PharmD []  Rosaline Millet, PharmD []  Iantha Batch, PharmD []  Arvin Gauss, PharmD []  Wanda Hasting, PharmD []  Ronal Rav, PharmD []  Rocky Slade, PharmD []  Bard Jeans, PharmD   Positive urine culture Treated with Cephalexin  and Metronidazole , organism sensitive to the same and no further patient follow-up is required at this time.  Linda Rivas 04/04/2024, 12:33 PM
